# Patient Record
Sex: Male | Born: 1950 | Race: White | Hispanic: No | Marital: Married
Health system: Midwestern US, Academic
[De-identification: ages and names within clinical notes are randomized; demographics above are authoritative.]

---

## 2017-04-10 ENCOUNTER — Encounter: Admit: 2017-04-10 | Discharge: 2017-04-10 | Payer: MEDICARE

## 2017-04-10 NOTE — Telephone Encounter
[facility] that pt needed cell adapter, since he moved in May his transmitter wasn't working. I called pt.  He moved in early May. Last remote connection was 03/11/17 because he has not plugged his transmitter back in. He does have cell adapter (pt thought this was a wifi adapter.) I told him to plug transmitter back in. He was on the road but he said he would.

## 2017-04-27 ENCOUNTER — Ambulatory Visit: Admit: 2017-04-27 | Discharge: 2017-04-27 | Payer: BC Managed Care – PPO

## 2017-04-27 DIAGNOSIS — Z950 Presence of cardiac pacemaker: ICD-10-CM

## 2017-04-27 DIAGNOSIS — I441 Atrioventricular block, second degree: Principal | ICD-10-CM

## 2017-07-26 DIAGNOSIS — R55 Syncope and collapse: Secondary | ICD-10-CM

## 2017-07-27 ENCOUNTER — Ambulatory Visit: Admit: 2017-07-27 | Discharge: 2017-07-27 | Payer: BC Managed Care – PPO

## 2017-07-27 DIAGNOSIS — Z950 Presence of cardiac pacemaker: ICD-10-CM

## 2017-07-27 DIAGNOSIS — I442 Atrioventricular block, complete: Principal | ICD-10-CM

## 2017-07-27 DIAGNOSIS — I441 Atrioventricular block, second degree: ICD-10-CM

## 2017-09-05 ENCOUNTER — Encounter: Admit: 2017-09-05 | Discharge: 2017-09-05 | Payer: MEDICARE

## 2017-09-05 NOTE — Progress Notes
URGENT    REQUEST FOR MEDICAL RECORDS FOR CONTINUITY OF CARE-UPCOMING OFFICE VISIT    PLEASE FAX:     MOST RECENT LAB/LIPID RESULTS      PLEASE FAX TO [phone]  ATTENTION:  Claire Dolores,RN    THANK YOU

## 2017-09-21 ENCOUNTER — Encounter: Admit: 2017-09-21 | Discharge: 2017-09-21 | Payer: MEDICARE

## 2017-09-21 DIAGNOSIS — S0990XA Unspecified injury of head, initial encounter: ICD-10-CM

## 2017-09-21 DIAGNOSIS — I10 Essential (primary) hypertension: ICD-10-CM

## 2017-09-21 DIAGNOSIS — R55 Syncope and collapse: ICD-10-CM

## 2017-09-21 DIAGNOSIS — E785 Hyperlipidemia, unspecified: ICD-10-CM

## 2017-09-21 MED ORDER — CARVEDILOL 6.25 MG PO TAB
6.25 mg | ORAL_TABLET | Freq: Two times a day (BID) | ORAL | 3 refills | 90.00000 days | Status: AC
Start: 2017-09-21 — End: 2017-10-16
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
## 2017-09-22 ENCOUNTER — Ambulatory Visit: Admit: 2017-09-21 | Discharge: 2017-09-22 | Payer: BC Managed Care – PPO

## 2017-09-22 DIAGNOSIS — I519 Heart disease, unspecified: ICD-10-CM

## 2017-09-22 DIAGNOSIS — Z79899 Other long term (current) drug therapy: ICD-10-CM

## 2017-09-22 DIAGNOSIS — I10 Essential (primary) hypertension: Principal | ICD-10-CM

## 2017-09-22 DIAGNOSIS — Z950 Presence of cardiac pacemaker: ICD-10-CM

## 2017-10-05 ENCOUNTER — Ambulatory Visit: Admit: 2017-10-05 | Discharge: 2017-10-05 | Payer: BC Managed Care – PPO

## 2017-10-05 DIAGNOSIS — I10 Essential (primary) hypertension: Principal | ICD-10-CM

## 2017-10-05 MED ORDER — PERFLUTREN LIPID MICROSPHERES 1.1 MG/ML IV SUSP
1-20 mL | Freq: Once | INTRAVENOUS | 0 refills | Status: CP
Start: 2017-10-05 — End: ?
  Administered 2017-10-05: 18:00:00 2 mL via INTRAVENOUS

## 2017-10-05 NOTE — Progress Notes
Peripheral IV Insertion Note:  Patient Side: left  Line Orientation:Antecubital  IV Catheter Size: 22G  Number of Attempts:1.  IV capped and flushed with Normal Saline.  IV site without redness, swelling, or pain.  New dressing placed.    After procedure IV cannula removed intact and hemostasis achieved.

## 2017-10-16 ENCOUNTER — Encounter: Admit: 2017-10-16 | Discharge: 2017-10-16 | Payer: MEDICARE

## 2017-10-16 DIAGNOSIS — I10 Essential (primary) hypertension: Principal | ICD-10-CM

## 2017-10-16 MED ORDER — CARVEDILOL 6.25 MG PO TAB
9.375 mg | Freq: Two times a day (BID) | ORAL | 0 refills | 90.00000 days | Status: AC
Start: 2017-10-16 — End: 2018-09-03

## 2017-10-16 NOTE — Telephone Encounter
-----   Message from Harrie ForemanJonathan Freeman, MD sent at 10/15/2017  9:22 AM CST -----  Regarding: RE: Echo results  Jacki ConesLaurie,    Yes I am sorry did not ask you to get in touch with him, I am not sure how I did not send in basket message.  I reviewed his echocardiogram and compared to previous.    On my review, EF is unchanged if not slightly improved, about 45% just mildly depressed    Let us continue with carvedilol.  Please check on his home blood pressure readings, if consistently greater than 130, I would like to increase to 9.375 twice daily    Thanks  Jon  ----- Message -----  From: Ginette OttoNelson, Rachael Zapanta, RN  Sent: 10/15/2017   8:57 AM  To: Harrie ForemanJonathan Freeman, MD  Subject: Echo results                                     Jon,    Noted you reviewed Echo completed 10/05/17.  EF remains depressed.    OK to call pt with results?  Any further adjustment in Carvedilol.    Thank you,    Jacki ConesLaurie

## 2017-10-16 NOTE — Telephone Encounter
Able to call and discuss with Mr Wesley Stanley.    Stopped coffee for 3 days and SBP was 120's. However, if he has coffee, his SBP is >130's    He usually drinks about 3-4 cups per day.      Discussed with Dr Neale BurlyFreeman and will go ahead and make increase in Carvedilol.      Pt will call for low BP/HR readings/dizziness/lightheadedness. He will check BP before doses.  LN

## 2017-10-25 ENCOUNTER — Ambulatory Visit: Admit: 2017-10-25 | Discharge: 2017-10-26 | Payer: BC Managed Care – PPO

## 2017-10-26 DIAGNOSIS — I441 Atrioventricular block, second degree: ICD-10-CM

## 2017-10-26 DIAGNOSIS — I442 Atrioventricular block, complete: Principal | ICD-10-CM

## 2017-10-26 DIAGNOSIS — R55 Syncope and collapse: Secondary | ICD-10-CM

## 2017-10-26 DIAGNOSIS — Z950 Presence of cardiac pacemaker: Secondary | ICD-10-CM

## 2018-01-24 ENCOUNTER — Ambulatory Visit: Admit: 2018-01-24 | Discharge: 2018-01-25 | Payer: BC Managed Care – PPO

## 2018-01-24 DIAGNOSIS — I441 Atrioventricular block, second degree: Principal | ICD-10-CM

## 2018-01-25 DIAGNOSIS — Z950 Presence of cardiac pacemaker: Secondary | ICD-10-CM

## 2018-02-07 ENCOUNTER — Encounter: Admit: 2018-02-07 | Discharge: 2018-02-07 | Payer: MEDICARE

## 2018-02-07 DIAGNOSIS — R55 Syncope and collapse: ICD-10-CM

## 2018-02-07 DIAGNOSIS — S0990XA Unspecified injury of head, initial encounter: ICD-10-CM

## 2018-02-07 DIAGNOSIS — E785 Hyperlipidemia, unspecified: Principal | ICD-10-CM

## 2018-02-07 DIAGNOSIS — I10 Essential (primary) hypertension: ICD-10-CM

## 2018-03-14 ENCOUNTER — Encounter: Admit: 2018-03-14 | Discharge: 2018-03-14 | Payer: MEDICARE

## 2018-04-09 ENCOUNTER — Ambulatory Visit: Admit: 2018-04-09 | Discharge: 2018-04-10 | Payer: BC Managed Care – PPO

## 2018-04-09 ENCOUNTER — Encounter: Admit: 2018-04-09 | Discharge: 2018-04-09 | Payer: MEDICARE

## 2018-04-09 DIAGNOSIS — Z950 Presence of cardiac pacemaker: Principal | ICD-10-CM

## 2018-04-10 DIAGNOSIS — I441 Atrioventricular block, second degree: Principal | ICD-10-CM

## 2018-04-24 ENCOUNTER — Encounter: Admit: 2018-04-24 | Discharge: 2018-04-24 | Payer: MEDICARE

## 2018-04-24 ENCOUNTER — Ambulatory Visit: Admit: 2018-04-24 | Discharge: 2018-04-25 | Payer: BC Managed Care – PPO

## 2018-04-24 DIAGNOSIS — R55 Syncope and collapse: ICD-10-CM

## 2018-04-24 DIAGNOSIS — I441 Atrioventricular block, second degree: ICD-10-CM

## 2018-04-24 DIAGNOSIS — I10 Essential (primary) hypertension: Principal | ICD-10-CM

## 2018-04-24 DIAGNOSIS — S0990XA Unspecified injury of head, initial encounter: ICD-10-CM

## 2018-04-24 DIAGNOSIS — E785 Hyperlipidemia, unspecified: Principal | ICD-10-CM

## 2018-04-25 ENCOUNTER — Ambulatory Visit: Admit: 2018-04-25 | Discharge: 2018-04-26 | Payer: BC Managed Care – PPO

## 2018-04-26 DIAGNOSIS — Z950 Presence of cardiac pacemaker: ICD-10-CM

## 2018-04-26 DIAGNOSIS — I441 Atrioventricular block, second degree: Principal | ICD-10-CM

## 2018-07-26 ENCOUNTER — Ambulatory Visit: Admit: 2018-07-26 | Discharge: 2018-07-26 | Payer: BC Managed Care – PPO

## 2018-07-26 DIAGNOSIS — Z950 Presence of cardiac pacemaker: Secondary | ICD-10-CM

## 2018-07-26 DIAGNOSIS — I441 Atrioventricular block, second degree: Principal | ICD-10-CM

## 2018-09-03 ENCOUNTER — Encounter: Admit: 2018-09-03 | Discharge: 2018-09-03 | Payer: MEDICARE

## 2018-09-03 MED ORDER — CARVEDILOL 6.25 MG PO TAB
9.375 mg | ORAL_TABLET | Freq: Two times a day (BID) | ORAL | 3 refills | 90.00000 days | Status: AC
Start: 2018-09-03 — End: 2018-09-25

## 2018-09-25 ENCOUNTER — Encounter: Admit: 2018-09-25 | Discharge: 2018-09-25 | Payer: MEDICARE

## 2018-09-25 MED ORDER — CARVEDILOL 6.25 MG PO TAB
9.375 mg | ORAL_TABLET | Freq: Two times a day (BID) | ORAL | 1 refills | 90.00000 days | Status: AC
Start: 2018-09-25 — End: 2018-11-25

## 2018-11-05 ENCOUNTER — Encounter: Admit: 2018-11-05 | Discharge: 2018-11-05 | Payer: MEDICARE

## 2018-11-08 ENCOUNTER — Encounter: Admit: 2018-11-08 | Discharge: 2018-11-08 | Payer: MEDICARE

## 2018-11-11 ENCOUNTER — Ambulatory Visit: Admit: 2018-11-11 | Discharge: 2018-11-12 | Payer: MEDICARE

## 2018-11-12 DIAGNOSIS — R55 Syncope and collapse: Secondary | ICD-10-CM

## 2018-11-12 DIAGNOSIS — Z950 Presence of cardiac pacemaker: Secondary | ICD-10-CM

## 2018-11-12 DIAGNOSIS — I441 Atrioventricular block, second degree: Secondary | ICD-10-CM

## 2018-11-25 ENCOUNTER — Encounter: Admit: 2018-11-25 | Discharge: 2018-11-25 | Payer: MEDICARE

## 2018-11-25 MED ORDER — CARVEDILOL 6.25 MG PO TAB
9.375 mg | ORAL_TABLET | Freq: Two times a day (BID) | ORAL | 1 refills | 90.00000 days | Status: AC
Start: 2018-11-25 — End: 2019-05-07

## 2019-02-13 ENCOUNTER — Encounter: Admit: 2019-02-13 | Discharge: 2019-02-13 | Payer: MEDICARE

## 2019-02-13 ENCOUNTER — Ambulatory Visit: Admit: 2019-02-13 | Discharge: 2019-02-14 | Payer: MEDICARE

## 2019-02-14 DIAGNOSIS — Z950 Presence of cardiac pacemaker: ICD-10-CM

## 2019-02-14 DIAGNOSIS — I441 Atrioventricular block, second degree: Principal | ICD-10-CM

## 2019-02-14 DIAGNOSIS — R55 Syncope and collapse: ICD-10-CM

## 2019-05-02 ENCOUNTER — Encounter: Admit: 2019-05-02 | Discharge: 2019-05-02

## 2019-05-02 ENCOUNTER — Ambulatory Visit: Admit: 2019-05-02 | Discharge: 2019-05-02

## 2019-05-02 DIAGNOSIS — Z950 Presence of cardiac pacemaker: Secondary | ICD-10-CM

## 2019-05-02 DIAGNOSIS — I441 Atrioventricular block, second degree: Secondary | ICD-10-CM

## 2019-05-05 ENCOUNTER — Encounter: Admit: 2019-05-05 | Discharge: 2019-05-05

## 2019-05-05 NOTE — Progress Notes
Mena Goes has an appointment with Dr. Domingo Cocking on 05/07/2019.    Please send recent lab results for continuity of care.    Thank you,   Kelsi Benham    Phone: [phone]  Fax: [phone]

## 2019-05-07 ENCOUNTER — Encounter: Admit: 2019-05-07 | Discharge: 2019-05-07

## 2019-05-07 ENCOUNTER — Ambulatory Visit: Admit: 2019-05-07 | Discharge: 2019-05-08

## 2019-05-07 DIAGNOSIS — I441 Atrioventricular block, second degree: Secondary | ICD-10-CM

## 2019-05-07 DIAGNOSIS — Z950 Presence of cardiac pacemaker: Secondary | ICD-10-CM

## 2019-05-07 DIAGNOSIS — I10 Essential (primary) hypertension: Secondary | ICD-10-CM

## 2019-05-07 DIAGNOSIS — R55 Syncope and collapse: Secondary | ICD-10-CM

## 2019-05-07 DIAGNOSIS — Z8679 Personal history of other diseases of the circulatory system: Secondary | ICD-10-CM

## 2019-05-07 DIAGNOSIS — S0990XA Unspecified injury of head, initial encounter: Secondary | ICD-10-CM

## 2019-05-07 DIAGNOSIS — E785 Hyperlipidemia, unspecified: Secondary | ICD-10-CM

## 2019-05-07 MED ORDER — CARVEDILOL 12.5 MG PO TAB
12.5 mg | ORAL_TABLET | Freq: Two times a day (BID) | ORAL | 3 refills | 90.00000 days | Status: DC
Start: 2019-05-07 — End: 2020-05-04

## 2019-05-07 NOTE — Patient Instructions
Your EKG looks fine today, unchanged    Your exam is normal    Recent pacemaker check looks fine    Your weight looks good.  Keep up your exercise habits    I'll plan for a follow up clinic visit in 1 year, or sooner if you're having questions or problems.

## 2019-05-07 NOTE — Progress Notes
Date of Service: 05/07/2019    Wesley Stanley is a 68 y.o. male.       HPI       I had the opportunity to see Wesley Stanley back today for a followup visit.  Wesley Stanley is a delightful 68 year old individual.  I had last seen him about a year ago.  He has a history of high-grade AV block, relatively transient.  He had recurrent syncope.  His AV block was diagnosed on a ZIO Patch monitor in September 2017.  He had pauses of up to 3.3 seconds at that time.  That, coupled with his syncopal event, led to his pacemaker implant with [place] in October of 2017.     Wesley Stanley has been stable over the past year.  His pacemaker checks have shown minimal pacing, less than 1%.  He has had no concerning atrial or ventricular rhythm disturbances.  He had an in-office check with [place] just last week.     The patient had lab testing done in April, and his CBC was normal, CMP normal, TSH normal.     Wesley Stanley has mildly depressed LV function, EF of around 45%.  He is expressing no symptoms of heart failure whatsoever.     I was excited to hear about his home.  They have finally been able to move into the house just a week ago.  It took them about 3 years to construct the house.  They did most of the work on their own, and he is very proud of his work.  It is a beautiful home.  He showed me pictures today.  Wesley Stanley has lost about 20 pounds over the past year.  This has been deliberate.  He is doing an intermittent fasting diet.  He does not eat until he is hungry.  Oftentimes, this will be dinner before he has his first meal.    (ZOX:[id number])           Vitals:    05/07/19 1033 05/07/19 1034   BP: 120/82 128/82   BP Source: Arm, Right Upper Arm, Left Upper   Pulse: 72    Weight: 83.5 kg (184 lb)    Height: 1.778 m (5' 10)    PainSc: Zero      Body mass index is 26.4 kg/m???.     Past Medical History  Patient Active Problem List    Diagnosis Date Noted   ??? Pacemaker 03/16/2017   ??? Syncope 03/14/2017 07/24/16-Carotid US: Heterogeneous plaquing of the bilateral carotid bifurcations.  No evidence of hemodynamically significant stenosis of the bilateral carotid arterial systems.  Antegrade vertebral artery flow bilaterally.  No evidence of hemodynamically significant stenosis of the bilateral proximal subclavian arteries.   08/30/16-Regadenoson Thallium MPI:  EF 40%.  Study is abnormal primarily due to global LV hypokinesis with an estimated ejection fraction of 40%. No evidence of significant myocardial ischemia. ECG portion of the study is negative for ischemia.  ???     ??? 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 Abnormal ultrasound cardiogram 08/23/2016   ??? Hyperlipemia 08/17/2016   ??? HTN (hypertension) 08/17/2016     01/03/17-Echo:  Ef 45%.  No significant valve disease is identified.  Normal estimated PA systolic pressure.   10/05/17-Echo:  EF 40 %, global hypokinesis.  Grade I (mild) left ventricular diastolic dysfunction.  No significant valvular abnormalities.  Estimated Peak Systolic PA Pressure:  19 mmHg.  Sinuses of Valsalva are mildly dilated. Carvedilol dose increased.          ??? 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 AV block, 2nd degree      9/17 zio patch monitor:  Patient had a min HR of 20 bpm, max HR of 200 bpm, and avg HR of 93 bpm.  Predominant underlying rhythm was Sinus Rhythm.  4 episode(s) of AV Block (2nd??? Mobitz II and High Grade) occurred, lasting a total of 28 secs.   Episode of High Grade AV block was associated with 3.3 seconds of Ventricular Asystole.   3 Supraventricular Tachycardia runs occurred, the run with the fastest interval lasting 11 beats with a max rate of 200 bpm (avg 168 bpm); the run with the fastest interval was also the longest.  Isolated  SVEs were rare (<1.0%, 331), SVE Couplets were rare (<1.0%, 28), and SVE Triplets were rare (<1.0%, 1). Isolated VEs were rare (<1.0%, 249), VE Couplets were rare (<1.0%, 1), and VE Triplets were rare (<1.0%, 1).  No symptoms were reported  No atrial fibrillation or flutter was recorded           Review of Systems   Constitution: Negative.   HENT: Negative.    Eyes: Negative.    Cardiovascular: Negative.    Respiratory: Negative.    Endocrine: Negative.    Hematologic/Lymphatic: Negative.    Skin: Negative.    Gastrointestinal: Negative.    Genitourinary: Negative.    Neurological: Negative.    Psychiatric/Behavioral: Negative.    Allergic/Immunologic: Negative.        Physical Exam    General Appearance: resting comfortably, no acute distress  Skin: warm, moist, no ulcers or xanthomas  Digits and Nails: no clubbing  Eyes: conjunctivae and lids normal, pupils are equal and round  Lips & Oral Mucosa: no pallor or cyanosis  Ear, Nose, Throat: No deformities, posterior oral pharynx clear  Neck Veins: neck veins are flat, neck veins are not distended  Chest Inspection: chest is normal in appearance, pacemaker left chest  Respiratory Effort: breathing is unlabored, no respiratory distress  Auscultation/Percussion: lungs clear to auscultation, no rales, rhonchi, or wheezing  PMI: PMI not enlarged or displaced  Cardiac Rhythm: regular rhythm and normal rate  Cardiac Auscultation: Normal S1 & S2, no S3 or S4, no rub  Murmurs: no cardiac murmurs   Carotid Arteries: normal carotid upstroke bilaterally, no bruits  Pedal Pulses: normal symmetric pedal pulses  Lower Extremity Edema: no lower extremity edema  Abdominal Exam: soft, non-tender, no masses, bowel sounds normal  Abdominal Aorta: nonpalpable abdominal aorta; no abdominal bruits  Liver & Spleen: no organomegaly  Gait & Station: normal balance and gait  Muscle Strength: normal strength and tone  Neurologic Exam: neurological assessment grossly intact  Orientation: oriented to time, place and person  Affect & Mood: appropriate and sustained affect Other: moves all extremities    Cardiovascular Studies  ECG???normal sinus rhythm, 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   ??? AV block, 2nd degree Yes   ??? Second-degree heart block        Assessment and Plan     1. Essential hypertension.  The patient's blood pressure is at goal, 120/82.  He will continue on carvedilol.  2. Pacemaker.  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  His rhythm disturbance was discovered on a Zio patch monitor at that  time.  Shortly thereafter, he underwent pacemaker implant.  He has a St. Jude dual-chamber device.  This was interrogated in the office a week ago.  We are seeing less than 1% A or V pacing.  No arrhythmias were identified with this recent check.  Notably, he has had no further syncopal episodes.  His pacing really has been minimal over the past 2 years.  3. Hyperlipidemia.  Dr. Bethena Midget, I will defer to you for ongoing lipid management.  He is currently not on lipid-lowering therapy.  This is an individual who is not known to have coronary artery disease.  I would like to see his LDL ideally less than 100.  Notably, his stress test with [place] in 2017 was normal with respect to perfusion without suggestion of coronary disease.     I look forward to seeing Wesley Stanley back in the office in 1 year.  He will have a pacemaker check around that timeframe.    (OZD:[id number])             Current Medications (including today's revisions)  ??? aspirin EC 81 mg tablet Take 81 mg by mouth daily. Take with food.   ??? CALCIUM CARBONATE/VITAMIN D3 (VITAMIN D-3 PO) Take  by mouth.   ??? carvediloL (COREG) 12.5 mg tablet Take one tablet by mouth twice daily. Take with food.   ??? cyanocobalamin (vitamin B-12) (VITAMIN B-12 PO) Place 1,000 mcg under tongue daily.   ??? MULTIVITAMIN PO Take  by mouth.   ??? NP THYROID 60 mg (1 gr) tablet Take 60 mg by mouth daily.   ??? TESTOSTERONE IM Inject  into the muscle every 7 days.

## 2019-05-08 DIAGNOSIS — E7849 Other hyperlipidemia: Secondary | ICD-10-CM

## 2019-05-30 ENCOUNTER — Encounter: Admit: 2019-05-30 | Discharge: 2019-05-30

## 2019-06-05 ENCOUNTER — Encounter: Admit: 2019-06-05 | Discharge: 2019-06-05

## 2019-06-05 ENCOUNTER — Ambulatory Visit: Admit: 2019-06-05 | Discharge: 2019-06-05

## 2019-06-05 DIAGNOSIS — I441 Atrioventricular block, second degree: Principal | ICD-10-CM

## 2019-06-05 DIAGNOSIS — Z950 Presence of cardiac pacemaker: Secondary | ICD-10-CM

## 2019-08-19 ENCOUNTER — Ambulatory Visit: Admit: 2019-08-19 | Discharge: 2019-08-19 | Payer: MEDICARE

## 2019-08-19 DIAGNOSIS — Z950 Presence of cardiac pacemaker: Secondary | ICD-10-CM

## 2019-08-19 DIAGNOSIS — I441 Atrioventricular block, second degree: Secondary | ICD-10-CM

## 2019-11-18 ENCOUNTER — Ambulatory Visit: Admit: 2019-11-18 | Discharge: 2019-11-18 | Payer: MEDICARE

## 2019-11-18 DIAGNOSIS — Z950 Presence of cardiac pacemaker: Secondary | ICD-10-CM

## 2019-11-18 DIAGNOSIS — I441 Atrioventricular block, second degree: Secondary | ICD-10-CM

## 2020-05-04 ENCOUNTER — Encounter: Admit: 2020-05-04 | Discharge: 2020-05-04 | Payer: MEDICARE

## 2020-05-04 DIAGNOSIS — I441 Atrioventricular block, second degree: Secondary | ICD-10-CM

## 2020-05-04 MED ORDER — CARVEDILOL 12.5 MG PO TAB
ORAL_TABLET | Freq: Two times a day (BID) | ORAL | 0 refills | 90.00000 days | Status: AC
Start: 2020-05-04 — End: ?

## 2020-05-04 NOTE — Progress Notes
STAT    Records Request for continuity of care.    Patient Wesley Stanley DOB 07-16-1951      Please fax:     Most recent lab results from May of 2020 to current    Please fax results to [phone]    Thank you,    Floyde Parkins, RN  Nurse with Harrie Foreman, MD

## 2020-05-12 ENCOUNTER — Encounter: Admit: 2020-05-12 | Discharge: 2020-05-12 | Payer: MEDICARE

## 2020-06-03 ENCOUNTER — Encounter: Admit: 2020-06-03 | Discharge: 2020-06-03 | Payer: MEDICARE

## 2020-06-03 NOTE — Telephone Encounter
06/03/20 We did not receive a transmission today. I attempted to reach patient and left voicemail to notify patient and gave him instructions for how to send a manual transmission. Left callback number if he has any further questions.

## 2020-06-03 NOTE — Telephone Encounter
06/03/20 Remote transmission received. I left voicemail to notify patient.

## 2020-06-03 NOTE — Telephone Encounter
Received notification that  St. Jude Merlin has not been connected since 04-03-2020.       Patient was instructed to look at his/her transmitter to make sure that it is plugged into power and send a manual transmission to reconnect the transmitter. If he/she has any questions about how to send a transmission or if the transmitter does not appear to be working properly, they need to contact the device company directly. Patient was provided with that contact number. Requested the patient send [place] a MyChart message or contact our device nurses at [phone] to let us know after they have sent their transmission.  Called preferred phone number [phone], Spoke with Wesley Stanley. Patient verbalized understanding. Pt does not login to MyChart. If he needs help hell call Merlin.     CDJ

## 2020-07-15 ENCOUNTER — Encounter: Admit: 2020-07-15 | Discharge: 2020-07-15 | Payer: MEDICARE

## 2020-07-15 NOTE — Progress Notes
Wesley Stanley, [birth year] has an appointment with Dr. Neale Burly on 07/21/20.    Please send recent lab results for continuity of care.    Thank you,   [name]    Phone: [phone]  Fax: [phone]

## 2020-07-16 ENCOUNTER — Encounter: Admit: 2020-07-16 | Discharge: 2020-07-16 | Payer: MEDICARE

## 2020-07-16 ENCOUNTER — Ambulatory Visit: Admit: 2020-07-16 | Discharge: 2020-07-16 | Payer: MEDICARE

## 2020-07-16 DIAGNOSIS — I441 Atrioventricular block, second degree: Secondary | ICD-10-CM

## 2020-07-21 ENCOUNTER — Encounter: Admit: 2020-07-21 | Discharge: 2020-07-21 | Payer: MEDICARE

## 2020-07-21 ENCOUNTER — Ambulatory Visit: Admit: 2020-07-21 | Discharge: 2020-07-21 | Payer: MEDICARE

## 2020-07-21 DIAGNOSIS — I10 Essential (primary) hypertension: Secondary | ICD-10-CM

## 2020-07-21 DIAGNOSIS — E785 Hyperlipidemia, unspecified: Principal | ICD-10-CM

## 2020-07-21 DIAGNOSIS — I441 Atrioventricular block, second degree: Secondary | ICD-10-CM

## 2020-07-21 DIAGNOSIS — S0990XA Unspecified injury of head, initial encounter: Secondary | ICD-10-CM

## 2020-07-21 DIAGNOSIS — R55 Syncope and collapse: Secondary | ICD-10-CM

## 2020-07-21 NOTE — Patient Instructions
Exam is normal    EKG looks fine, no concerns    We have made no adjustment in your current medications    Stay active as you are    I'll plan for a follow up clinic visit in 1 year, or sooner if you're having questions or problems.     We will check your pacemaker just prior    I work with a wonderful group of nurses that are part of the purple team.  There is a shared phone number for this group of nurses, [phone].  Please contact this number should you have any questions about your visit today or concerns about upcoming appointments/procedures.  We would like to know if you are having any worrisome cardiac symptoms.     Please call [phone] if needing to schedule an office visit.     We are also available by e-mail through the MyChart system if you have non-urgent concerns. We will get back to you as soon as possible. For urgent or after hours needs, please call [phone].

## 2020-08-02 ENCOUNTER — Encounter: Admit: 2020-08-02 | Discharge: 2020-08-02 | Payer: MEDICARE

## 2020-08-02 DIAGNOSIS — I441 Atrioventricular block, second degree: Secondary | ICD-10-CM

## 2020-08-02 MED ORDER — CARVEDILOL 12.5 MG PO TAB
ORAL_TABLET | Freq: Two times a day (BID) | ORAL | 0 refills | 90.00000 days | Status: AC
Start: 2020-08-02 — End: ?

## 2020-08-18 ENCOUNTER — Encounter: Admit: 2020-08-18 | Discharge: 2020-08-18 | Payer: MEDICARE

## 2020-10-31 ENCOUNTER — Encounter: Admit: 2020-10-31 | Discharge: 2020-10-31 | Payer: MEDICARE

## 2020-10-31 DIAGNOSIS — I441 Atrioventricular block, second degree: Principal | ICD-10-CM

## 2020-10-31 MED ORDER — CARVEDILOL 12.5 MG PO TAB
ORAL_TABLET | Freq: Two times a day (BID) | 0 refills
Start: 2020-10-31 — End: ?

## 2020-11-19 ENCOUNTER — Encounter: Admit: 2020-11-19 | Discharge: 2020-11-19 | Payer: MEDICARE

## 2021-02-17 ENCOUNTER — Encounter: Admit: 2021-02-17 | Discharge: 2021-02-17 | Payer: MEDICARE

## 2021-02-25 ENCOUNTER — Encounter: Admit: 2021-02-25 | Discharge: 2021-02-25 | Payer: MEDICARE

## 2021-02-25 ENCOUNTER — Ambulatory Visit: Admit: 2021-02-25 | Discharge: 2021-02-25 | Payer: MEDICARE

## 2021-02-25 DIAGNOSIS — Z950 Presence of cardiac pacemaker: Secondary | ICD-10-CM

## 2021-02-25 DIAGNOSIS — I441 Atrioventricular block, second degree: Principal | ICD-10-CM

## 2021-02-25 DIAGNOSIS — I10 Essential (primary) hypertension: Secondary | ICD-10-CM

## 2021-02-25 MED ORDER — CARVEDILOL 25 MG PO TAB
25 mg | ORAL_TABLET | Freq: Two times a day (BID) | ORAL | 1 refills | 90.00000 days | Status: AC
Start: 2021-02-25 — End: ?

## 2021-02-25 NOTE — Telephone Encounter
02/25/2021 2:02 PM     Pt called reports that awhile back, he started experiencing CP/tightness with exertion and remembered that JAF had mentioned increasing his carvedilol but pt had been apprehensive at the time. Since then he has increased carvedilol to 25mg  BID (2 12.5mg  tabs BID) and his CP/tightness with exertion has completely resolved. Pt reports that BPs run about 110-120s/70-80s and pt "feels great" since med dosage increase.      Informed pt that I will update his chart and med list and let JAF know. Asked that he let staff know if he needs more refills sent to pharmacy before his annual appt with JAF in Sept. Pt verbalized understanding & agreeable to POC.

## 2021-04-26 ENCOUNTER — Encounter: Admit: 2021-04-26 | Discharge: 2021-04-26 | Payer: MEDICARE

## 2021-04-26 NOTE — Telephone Encounter
Received notification that  [employer]. Jude Merlin has not been connected since 04/23/21. Patient was instructed to look at his/her transmitter to make sure that it is plugged into power and send a manual transmission to reconnect the transmitter. If he/she has any questions about how to send a transmission or if the transmitter does not appear to be working properly, they need to contact the device company directly. Patient was provided with that contact number. Requested the patient send [place] a MyChart message or contact our device nurses at [phone] to let us know after they have sent their transmission. Patient has been out of town but is getting back today and will send in a transmission to reconnect. Patient verbalized understanding.  BC    Note: Patient needs to send a manual remote interrogation to reestablish communication to his/her remote transmitter.

## 2021-04-29 ENCOUNTER — Encounter: Admit: 2021-04-29 | Discharge: 2021-04-29 | Payer: MEDICARE

## 2021-05-03 ENCOUNTER — Encounter: Admit: 2021-05-03 | Discharge: 2021-05-03 | Payer: MEDICARE

## 2021-05-03 NOTE — Telephone Encounter
-----   Message from Jana Half, MD sent at 05/03/2021  2:42 PM CDT -----  Regarding: RE: Possible Side Effects of my medication   Aadi Bordner,    We are going to try to get him worked in with Augusto Gamble in 2 weeks at [place] office to discuss symptoms and likely arrange for testing    [facility] was going to look at date in July for Jody    Thanks  ----- Message -----  From: Laroy Apple, RN  Sent: 05/03/2021   9:21 AM CDT  To: Jana Half, MD  Subject: FW: Possible Side Effects of my medication       JAF. See pt communication about pt not feeling well recently with SOB and occasional chest pressure. I ended up calling pt as I wanted to confirm dose on Coreg. Pt is taking 25mg  BID (two 12.5mg  tabs BID). He is not sure why he is taking 2 baby ASA not one.     He did say he does not feel like this is urgent but did not wanted you to be surprised in Sept at his s/s. I assured him we would want to know about any changes going on. Pt states he wonders if he has Covid long haulers issues. Pt is trying to start a walking program on level ground and will slowly progress as he tolerates with his SOB. I said I would review with JAF to see if work-in needed or any testing completed. Pt would need an OV if any stress testing needed. Thank you    ----- Message -----  From: 08-25-1975, BSN  Sent: 04/30/2021   3:37 PM CDT  To: [facility] Nurse Gen Card Team [facility]  Subject: FW: Possible Side Effects of my medication         ----- Message -----  From: 05/02/2021  Sent: 04/29/2021   9:29 PM CDT  To: [facility] Nurse Gen Card Team 05/01/2021  Subject: Possible Side Effects of my medication           And 2 baby aspirin with my vitamins everyday.

## 2021-05-05 ENCOUNTER — Encounter: Admit: 2021-05-05 | Discharge: 2021-05-05 | Payer: MEDICARE

## 2021-05-05 NOTE — Telephone Encounter
05/05/2021 9:50 AM     Called pt and discussed appt with SHT on 7/7 at the [place] office. Pt confirmed that this would work for him. Appt scheduled, gave pt directions to the [place] office. MC

## 2021-05-10 ENCOUNTER — Encounter: Admit: 2021-05-10 | Discharge: 2021-05-10 | Payer: MEDICARE

## 2021-05-10 NOTE — Progress Notes
Records Request    Medical records request for continuation of care:    Patient has appointment on 7.7.22   with  Dr. Carollee Herter Hoos-Thompson .    Please fax records to [facility] [place] [facility] [phone]    Request records:    Most Recent Labs    Thank you,      Cardiovascular Medicine  [facility]  [address]  [place], [place] [address]  Phone:  [phone]  Fax:  [phone]

## 2021-05-12 ENCOUNTER — Encounter: Admit: 2021-05-12 | Discharge: 2021-05-12 | Payer: MEDICARE

## 2021-05-12 DIAGNOSIS — I10 Essential (primary) hypertension: Secondary | ICD-10-CM

## 2021-05-12 DIAGNOSIS — Z950 Presence of cardiac pacemaker: Secondary | ICD-10-CM

## 2021-05-12 DIAGNOSIS — R55 Syncope and collapse: Secondary | ICD-10-CM

## 2021-05-12 DIAGNOSIS — R0789 Other chest pain: Secondary | ICD-10-CM

## 2021-05-12 DIAGNOSIS — R0600 Dyspnea, unspecified: Secondary | ICD-10-CM

## 2021-05-12 DIAGNOSIS — U071 COVID: Secondary | ICD-10-CM

## 2021-05-12 DIAGNOSIS — S0990XA Unspecified injury of head, initial encounter: Secondary | ICD-10-CM

## 2021-05-12 DIAGNOSIS — I441 Atrioventricular block, second degree: Secondary | ICD-10-CM

## 2021-05-12 DIAGNOSIS — E785 Hyperlipidemia, unspecified: Secondary | ICD-10-CM

## 2021-05-12 MED ORDER — ROSUVASTATIN 10 MG PO TAB
10 mg | ORAL_TABLET | Freq: Every day | ORAL | 3 refills | 90.00000 days | Status: AC
Start: 2021-05-12 — End: ?

## 2021-05-12 MED ORDER — CARVEDILOL 25 MG PO TAB
25 mg | ORAL_TABLET | Freq: Two times a day (BID) | ORAL | 3 refills | 90.00000 days | Status: AC
Start: 2021-05-12 — End: ?

## 2021-05-12 MED ORDER — CARVEDILOL 25 MG PO TAB
25 mg | ORAL_TABLET | Freq: Two times a day (BID) | ORAL | 1 refills | 90.00000 days | Status: DC
Start: 2021-05-12 — End: 2021-05-12

## 2021-05-12 MED ORDER — ROSUVASTATIN 10 MG PO TAB
10 mg | ORAL_TABLET | Freq: Every day | ORAL | 1 refills | 90.00000 days | Status: DC
Start: 2021-05-12 — End: 2021-05-12

## 2021-05-12 NOTE — Patient Instructions
MAC Nuclear Stress Test Instructions    PLEASE REPORT TO:    ____[facility] [phone], [place], [place]) - [phone]   ____[facility] ([address], [address], [place], [place]) - [phone]    ____[facility] ([address] [place], [place]) - [phone]    ____[facility] ([address], [place], [place]) - [phone]   ____[facility] ([address] [place], [place] ) - [phone]     MAC Main Phone Number: [phone]     Date of Test  ____________  at ____________  for ________________________    Are you able to raise your arm up by your head for about 20 minutes? yes  Can you lie on your back for approximately 20 minutes with minimal movement? yes     The Thallium evaluation has two parts -- two nuclear scans.   The first scan is done in the morning and the second three to four hours later.     Wear comfortable clothing. Bring or wear comfortable walking shoes.     It is recommended not to hold infants for 2 to 3 days after the test.   Please let the nuclear technologists know if you plan on flying after the test.     NO DECAF OR CAFFEINE 24 HOURS PRIOR TO TEST. Examples: coffee, tea, decaf coffee or tea, cola, chocolate.     DO NOT EAT OR DRINK THE MORNING OF YOUR TEST unless otherwise instructed. (You may have a couple sips of water.) If you are a diabetic, if insulin dependent: please take one third of your insulin with a light breakfast (two pieces of dry toast and a small juice). Bring insulin and medication with you to the test.     ___ TAKE MORNING MEDICATIONS WITH A COUPLE SIPS OF WATER PRIOR TO TEST.     Hold all vitamins and supplements on the morning of your test.     HOLD THE FOLLOWING MEDICATIONS AS INDICATED BELOW:             WHAT TO DO BETWEEN THE FIRST TWO THALLIUM SCANS:  1. No strenuous exercise should be performed during this time.  2. A light lunch is permissible. The technologist will give you a list of appropriate foods.  3. Please return 15 minutes prior to the schedule of your second scan. Our nuclear technologist will tell you exactly what time to return.  4. Please do not use tobacco products in between scans.  5. After the first scan is completed, you may resume usual medications.     TEST FINDINGS:  You will receive the results of the test within 7 business days of its completion by telephone, unless arranged differently at the time of the procedure.  If you have any questions concerning your thallium test or if you do not hear from your [facility] physician/or nurse within 7 business days, please call the appropriate office checked above.            [facility] should call you.  Their direct line is [phone]    Follow up as directed.  Call sooner if issues.  Call the [place] nursing line at [phone].  Leave a detailed message for the nurse in [place] [facility] with how we can assist you and we will call you back.

## 2021-05-12 NOTE — Progress Notes
Date of Service: 05/12/2021    Wesley Stanley is a 70 y.o. male.       HPI     Patient is a 70 year old Caucasian male past medical history of syncopal episode in 2017 which led to a Zio patch and intermittent type II heart block.  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  He had COVID 19 in February of this year.  Since he is recovered from that acutely has noted increased activity intolerance and will experiencing chest heaviness when he is doing more vigorous activity like going up a hill or walking upstairs for exercise.  Initially increase his carvedilol from 12.5 mg p.o. twice daily to 25 mg p.o. twice daily and is seem to help.  Reports symptoms seem to breakthrough more currently.  He does take testosterone replacement therapy and testosterone levels have been elevated.  Cholesterol is also suboptimally controlled.  Takes chronic fish oil for his joint discomfort and relief.  No history of smoking, coronary artery disease, diabetes mellitus, or family history of early coronary artery disease.  Blood pressure is done really well on the carvedilol.  Denies any orthopnea or PND.  No recurrent syncope or near syncopal type episodes.  Device checks have been stable.  No palpitations or irregular heartbeats are detected.  Previous echocardiogram showed low normal left ventricular ejection fraction with echo and regadenoson study showing ejection fraction of 40% and no other structural concerns.         Vitals:    05/12/21 0916 05/12/21 0928   BP: (!) 136/90 (!) 140/84   BP Source: Arm, Left Upper Arm, Right Upper   Pulse: 80    SpO2: 96%    O2 Percent: 96 %    O2 Device: None (Room air)    PainSc: Zero    Weight: 89 kg (196 lb 3.2 oz)    Height: 177.8 cm (5' 10)      Body mass index is 28.15 kg/m?[name]     Past Medical History  Patient Active Problem List    Diagnosis Date Noted   ? Pacemaker 03/16/2017   ? Syncope 03/14/2017     07/24/16-Carotid US: Heterogeneous plaquing of the bilateral carotid bifurcations.  No evidence of hemodynamically significant stenosis of the bilateral carotid arterial systems.  Antegrade vertebral artery flow bilaterally.  No evidence of hemodynamically significant stenosis of the bilateral proximal subclavian arteries.   08/30/16-Regadenoson Thallium MPI:  EF 40%.  Study is abnormal primarily due to global LV hypokinesis with an estimated ejection fraction of 40%. No evidence of significant myocardial ischemia. ECG portion of the study is negative for ischemia.  ?     ? 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 Abnormal ultrasound cardiogram 08/23/2016   ? Hyperlipemia 08/17/2016   ? HTN (hypertension) 08/17/2016     01/03/17-Echo:  Ef 45%.  No significant valve disease is identified.  Normal estimated PA systolic pressure.   10/05/17-Echo:  EF 40 %, global hypokinesis.  Grade I (mild) left ventricular diastolic dysfunction.  No significant valvular abnormalities.  Estimated Peak Systolic PA Pressure: 19 mmHg.  Sinuses of Valsalva are mildly dilated. Carvedilol dose increased.          ? 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 AV block, 2nd degree      9/17 zio patch monitor:  Patient had a min  HR of 20 bpm, max HR of 200 bpm, and avg HR of 93 bpm.  Predominant underlying rhythm was Sinus Rhythm.  4 episode(s) of AV Block (2nd? Mobitz II and High Grade) occurred, lasting a total of 28 secs.   Episode of High Grade AV block was associated with 3.3 seconds of Ventricular Asystole.   3 Supraventricular Tachycardia runs occurred, the run with the fastest interval lasting 11 beats with a max rate of 200 bpm (avg 168 bpm); the run with the fastest interval was also the longest.  Isolated  SVEs were rare (<1.0%, 331), SVE Couplets were rare (<1.0%, 28), and SVE Triplets were rare (<1.0%, 1).  Isolated VEs were rare (<1.0%, 249), VE Couplets were rare (<1.0%, 1), and VE Triplets were rare (<1.0%, 1).  No symptoms were reported  No atrial fibrillation or flutter was recorded           Review of Systems   Constitutional: Negative.   HENT: Negative.         Nasal drip   Eyes: Negative.    Cardiovascular: Positive for chest pain and dyspnea on exertion.        Chest pressure on exertion   Respiratory: Positive for cough.    Endocrine: Negative.    Hematologic/Lymphatic: Negative.    Skin: Negative.    Musculoskeletal: Negative.    Gastrointestinal: Negative.    Genitourinary: Negative.    Neurological: Negative.    Psychiatric/Behavioral: Negative.         Confusion   Allergic/Immunologic: Negative.        Physical Exam  Awake and alert well-nourished male.  Lean body habitus and appears given age.  Accompanied by his wife  Pupils are equal react without scleral injection  Neck is supple normal carotid stroke and no bruits.  No masses no significant jugular venous abnormalities  Heart S1, S2 that are normal.  No murmurs, clicks, or gallops.  PMI is normal  Lungs are clear to auscultation without focal crackles or wheeze  Abdomen is soft, nontender and positive bowel sounds without bruits  Pulses are 2+, regular, and symmetric bilaterally radial as well as dorsalis pedis and posterior tibial locations  No peripheral edema normal hair growth in the legs and arms.  Does some superficial peeling of the skin in the pretibial location on the left      Cardiovascular Health Factors  Vitals BP Readings from Last 3 Encounters:   05/12/21 (!) 140/84   07/21/20 112/72   05/07/19 128/82     Wt Readings from Last 3 Encounters:   05/12/21 89 kg (196 lb 3.2 oz)   07/21/20 88.4 kg (194 lb 14.4 oz)   05/07/19 83.5 kg (184 lb)     BMI Readings from Last 3 Encounters:   05/12/21 28.15 kg/m?   07/21/20 27.97 kg/m?   05/07/19 26.40 kg/m?      Smoking Social History     Tobacco Use   Smoking Status Never Smoker   Smokeless Tobacco Never Used      Lipid Profile Cholesterol   Date Value Ref Range Status   03/02/2021 236 (H) <200 Final     HDL   Date Value Ref Range Status   03/02/2021 41  Final     LDL   Date Value Ref Range Status   03/02/2021 143 (H) <100 Final     Triglycerides   Date Value Ref Range Status   03/02/2021 337 (H) <150 Final  Blood Sugar No results found for: HGBA1C  Glucose   Date Value Ref Range Status   02/25/2020 104  Final   08/18/2016 105 (H) 70 - 100 MG/DL Final   82/95/6213 99 70 - 100 MG/DL Final   08/65/7846 98 70 - 100 mg/dL Final          Problems Addressed Today  Encounter Diagnoses   Name Primary?   ? Cardiac pacemaker in situ Yes   ? Other chest pain    ? COVID    ? DOE (dyspnea on exertion)        Assessment and Plan     Patient with persistent activity intolerance and chest pressure despite increase dosing of the carvedilol.  In discussion with the patient this did become an issue after his COVID-19 infection could be a sign of long-haul or COVID-19.  Does have risk factors for cardiovascular disease and findings do have a anginal component.  At this time we will continue the carvedilol at 25 mg p.o. twice daily.  We will have him start rosuvastatin 10 mg p.o. daily to help alleviate risk for cardiovascular events.  He is maintained on aspirin 81 mg p.o. daily.  Did discuss that he has to be cautious with testosterone therapy especially if he is overtreating.  We will arrange for treadmill nuclear cardiac stress test.  Determine any ischemia and reassess his LV function.  He should contact us if he is having escalating symptoms or be seen emergently if he has persistent symptoms that are not recovering from rest of the have been currently.  We will contact him about the results of his study.  Currently has follow-up appointment scheduled in September.         Current Medications (including today's revisions)  ? aspirin EC 81 mg tablet Take 81 mg by mouth daily. Take with food.   ? CALCIUM CARBONATE/VITAMIN D3 (VITAMIN D-3 PO) Take  by mouth daily.   ? carvediloL (COREG) 25 mg tablet Take one tablet by mouth twice daily with meals. Take with food.   ? cyanocobalamin (vitamin B-12) (VITAMIN B-12 PO) Place 1,000 mcg under tongue daily.   ? MAGNESIUM PO Take 470 mg by mouth daily.   ? NP THYROID 60 mg (1 gr) tablet Take 60 mg by mouth daily.   ? Omega-3-DHA-EPA-Fish Oil 1,000 mg (120 mg-180 mg) capsule Take 3 capsules by mouth daily.   ? TESTOSTERONE IM Inject  into the muscle every 7 days.

## 2021-05-20 ENCOUNTER — Encounter: Admit: 2021-05-20 | Discharge: 2021-05-20 | Payer: MEDICARE

## 2021-05-20 NOTE — Telephone Encounter
Patient was scheduled for a [employer] that has not been received. Patient was instructed to send a manual transmission. Instructed if the transmitter does not appear to be working properly, they need to contact the device company directly. Patient was provided with that contact number. Requested the patient send [place] a MyChart message or contact our device nurses at [phone] to let us know after they have sent their transmission. Spoke with patient and he will send in transmission today. Patient verbalized understanding.  BC  Note: Patient needs to send a manual transmission as we did not receive their scheduled remote interrogation.

## 2021-05-20 NOTE — Telephone Encounter
Patient calling [employer] for trouble shooting. BC

## 2021-05-25 ENCOUNTER — Encounter: Admit: 2021-05-25 | Discharge: 2021-05-25 | Payer: MEDICARE

## 2021-05-26 ENCOUNTER — Encounter: Admit: 2021-05-26 | Discharge: 2021-05-26 | Payer: MEDICARE

## 2021-05-27 ENCOUNTER — Encounter: Admit: 2021-05-27 | Discharge: 2021-05-27 | Payer: MEDICARE

## 2021-05-27 DIAGNOSIS — Z01812 Encounter for preprocedural laboratory examination: Secondary | ICD-10-CM

## 2021-05-27 DIAGNOSIS — I10 Essential (primary) hypertension: Secondary | ICD-10-CM

## 2021-05-27 DIAGNOSIS — R9439 Abnormal result of other cardiovascular function study: Secondary | ICD-10-CM

## 2021-05-27 MED ORDER — ACETAMINOPHEN 325 MG PO TAB
650 mg | ORAL | 0 refills | PRN
Start: 2021-05-27 — End: ?

## 2021-05-27 MED ORDER — ALUMINUM-MAGNESIUM HYDROXIDE 200-200 MG/5 ML PO SUSP
30 mL | ORAL | 0 refills | PRN
Start: 2021-05-27 — End: ?

## 2021-05-27 MED ORDER — DOCUSATE SODIUM 100 MG PO CAP
100 mg | Freq: Every day | ORAL | 0 refills | PRN
Start: 2021-05-27 — End: ?

## 2021-05-27 MED ORDER — NITROGLYCERIN 0.4 MG SL SUBL
.4 mg | SUBLINGUAL | 0 refills | PRN
Start: 2021-05-27 — End: ?

## 2021-05-27 MED ORDER — TEMAZEPAM 15 MG PO CAP
15 mg | Freq: Every evening | ORAL | 0 refills | PRN
Start: 2021-05-27 — End: ?

## 2021-05-27 NOTE — Telephone Encounter
05/27/2021 1:28 PM     Stress test images and report received from [facility]. [place], [place] sent report to be uploaded into onbase and images uploaded into nuance.     Verbally reviewed with JAF, who viewed the report and EKG images who states the EKGs are very abnormal and that he needs a LHC next week.

## 2021-05-27 NOTE — Telephone Encounter
-----   Message from Levora Angel, MD sent at 05/27/2021 11:38 AM CDT -----  Arrange for him to have coronary angiograms with possible PCI.  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  Thank you  ----- Message -----  From: Florene Route, RN  Sent: 05/27/2021  10:57 AM CDT  To: Levora Angel, MD    I am having them email me the rest of the stress report, EKG tracings, etc and I will forward to you for review. Thank you!  ----- Message -----  From: Levora Angel, MD  Sent: 05/27/2021  10:10 AM CDT  To: Florene Route, RN    Where do I find the report on the stress test?  ----- Message -----  From: Florene Route, RN  Sent: 05/27/2021   9:55 AM CDT  To: Levora Angel, MD    Abnormal Bruce MPI results for your review and recommendations. [facility] should also be clouding images over for review. Please let me or [facility] nursing team know your recommendations. Thank you!

## 2021-05-27 NOTE — Progress Notes
Abnormal stress results were recovered from [facility] after inadvertently being sent to [facility] to be read. Will forward results to [facility] for review and recommendations.

## 2021-05-27 NOTE — Telephone Encounter
Called and discussed abnormal results and recommendations with patient. Patient states that [facility] is not his normal provider and that he would really like to discuss options with his regular cardiologist, Dr. Neale Burly. Urged patient to reconsider procedure, but he requested to wait and speak with Dr. Neale Burly.       Spoke to [facility], RN on Dr. Onnie Boer team to inform her the patient's decision and the urgency of medical intervention. Mallori, RN states that she will review all stress test documents with Dr. Neale Burly and will reach out to the patient with recommendations.

## 2021-05-30 ENCOUNTER — Encounter: Admit: 2021-05-30 | Discharge: 2021-05-30 | Payer: MEDICARE

## 2021-05-30 DIAGNOSIS — R9439 Abnormal result of other cardiovascular function study: Secondary | ICD-10-CM

## 2021-05-31 ENCOUNTER — Encounter: Admit: 2021-05-31 | Discharge: 2021-05-31 | Payer: MEDICARE

## 2021-05-31 NOTE — Progress Notes
Medicare is listed as patient's primary insurance coverage.  Pre-certification is not required for hospitalizations.    Website with [employer] Program confirmed benefits and eligibility:  Current and active since 11/11/2006, this plan is secondary to Medicare A and B.  Plan waives prior authorization requirements for Medicare allowed services and procedures.  Reference [id number]

## 2021-06-05 ENCOUNTER — Encounter: Admit: 2021-06-05 | Discharge: 2021-06-05 | Payer: MEDICARE

## 2021-06-05 NOTE — Anesthesia Pre-Procedure Evaluation
Anesthesia Pre-Procedure Evaluation    Name: Wesley Stanley      MRN: [id number]     DOB: [birth year]     Age: 70 y.o.     Sex: male   _________________________________________________________________________     Procedure Info:   Procedure Information     Date/Time: 06/06/21 1651    Procedures:       ANGIOGRAPHY CORONARY ARTERY WITH LEFT HEART CATHETERIZATION (N/A )      POSSIBLE PERCUTANEOUS CORONARY STENT PLACEMENT WITH ANGIOPLASTY (N/A )    Location: CVOR 6 / CVOR    Providers: Julienne Kass, MD          Physical Assessment  Vital Signs (last filed in past 24 hours):         Patient History   No Known Allergies     Current Medications    Medication Directions   aspirin EC 81 mg tablet Take 81 mg by mouth daily. Take with food.   CALCIUM CARBONATE/VITAMIN D3 (VITAMIN D-3 PO) Take  by mouth daily.   carvediloL (COREG) 25 mg tablet Take one tablet by mouth twice daily with meals. Take with food.   cyanocobalamin (vitamin B-12) (VITAMIN B-12 PO) Place 1,000 mcg under tongue daily.   MAGNESIUM PO Take 470 mg by mouth daily.   NP THYROID 60 mg (1 gr) tablet Take 60 mg by mouth daily.   Omega-3-DHA-EPA-Fish Oil 1,000 mg (120 mg-180 mg) capsule Take 3 capsules by mouth daily.   rosuvastatin (CRESTOR) 10 mg tablet Take one tablet by mouth daily.   TESTOSTERONE IM Inject  into the muscle every 7 days.         Review of Systems/Medical History      Patient summary reviewed  Nursing notes reviewed  Pertinent labs reviewed    PONV Screening: Non-smoker  No history of anesthetic complications  No family history of anesthetic complications      Airway - negative          Cardiovascular       Recent diagnostic studies:          ECG, echocardiogram and stress test      Hypertension,       Dysrhythmias      Hyperlipidemia      Syncope      09/21/2017 TTE:  ? Technically difficult study; i.v. transpulmonary contrast was used to define the endocardial borders.  ? Moderately depressed left ventricular systolic function, estimated ejection fraction is 40 %, global hypokinesis.  ? Grade I (mild) left ventricular diastolic dysfunction.  ? Pacemaker lead present in the right atrium and right ventricle.  ? No significant valvular abnormalities.  ? Estimated Peak Systolic PA Pressure: 19 mmHg  ? The sinuses of Valsalva are mildly dilated.        MPI imaging report [facility] 05/17/21:  1. Global LV function is abnormal.  LVEF 45%.  2. Large mostly fixed perfusion defect involving the inferior and inferior lateral walls with evidence of peri-infarct ischemia.  EF calculated to be 45% on post-rest gated SPECT images.  With ECG changes during the exercise and recovery periods, recommend invasive diagnostic strategy.  3. Myocardial perfusion imaging abnormal.  4. 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    Constitution - negative   PHYSICAL EXAM     Diagnostic Tests  Hematology:   Lab Results   Component Value Date    HGB 17.2 03/02/2021    HCT 51.2 03/02/2021    PLTCT 195 03/02/2021    WBC 5.1 03/02/2021    NEUT 65.4 02/25/2020    ANC 3,466 02/25/2020    ALC 1,092 02/25/2020    MONA 11.9 02/25/2020    AMC 631 02/25/2020    ABC 32 02/25/2020    MCV 95.9 03/02/2021    MCH 32.2 03/02/2021    MCHC 33.6 03/02/2021    MPV 8.9 03/02/2021    RDW 12.5 03/02/2021         General Chemistry:   Lab Results   Component Value Date    NA 138 02/25/2020    K 4.3 02/25/2020    CL 103 02/25/2020    CO2 29 02/25/2020    GAP 7 08/18/2016    BUN 17 02/25/2020    CR 1.15 02/25/2020    GLU 104 02/25/2020    GLU 98 08/11/2016    CA 9.4 02/25/2020    ALBUMIN 4.0 02/25/2020    MG 2.2 08/11/2016    TOTBILI 1.0 02/25/2020      Coagulation: No results found for: PT, PTT, INR      Anesthesia Plan    ASA score: 3 Plan: MAC  Induction method: intravenous

## 2021-06-06 ENCOUNTER — Encounter: Admit: 2021-06-06 | Discharge: 2021-06-06 | Payer: MEDICARE

## 2021-06-06 DIAGNOSIS — I10 Essential (primary) hypertension: Secondary | ICD-10-CM

## 2021-06-06 DIAGNOSIS — S0990XA Unspecified injury of head, initial encounter: Secondary | ICD-10-CM

## 2021-06-06 DIAGNOSIS — R55 Syncope and collapse: Secondary | ICD-10-CM

## 2021-06-06 DIAGNOSIS — E785 Hyperlipidemia, unspecified: Secondary | ICD-10-CM

## 2021-06-06 MED ADMIN — SODIUM CHLORIDE 0.9 % IV SOLP [27838]: 500 mL | INTRAVENOUS | @ 17:00:00 | Stop: 2021-06-06 | NDC 00338004903

## 2021-06-06 MED ADMIN — SODIUM CHLORIDE 0.9 % IV SOLP [27838]: 1000.000 mL | INTRAVENOUS | @ 17:00:00 | Stop: 2021-06-07 | NDC 00338004904

## 2021-06-07 ENCOUNTER — Encounter: Admit: 2021-06-07 | Discharge: 2021-06-07 | Payer: MEDICARE

## 2021-06-07 MED ADMIN — ROSUVASTATIN 20 MG PO TAB [88504]: 10 mg | ORAL | @ 15:00:00 | NDC 68462026390

## 2021-06-07 MED ADMIN — HEPARIN (PORCINE) IN 5 % DEX 20,000 UNIT/500 ML (40 UNIT/ML) IV SOLP [3628]: 1304 [IU]/h | INTRAVENOUS | @ 19:00:00 | NDC 00264956710

## 2021-06-07 MED ADMIN — PERFLUTREN LIPID MICROSPHERES 1.1 MG/ML IV SUSP [79178]: 1.5 mL | INTRAVENOUS | @ 15:00:00 | Stop: 2021-06-07 | NDC 11994001116

## 2021-06-07 MED ADMIN — THYROID (PORK) 60 MG PO TAB [300009]: 60 mg | ORAL | @ 15:00:00 | NDC 42192033001

## 2021-06-07 MED ADMIN — MAGNESIUM OXIDE 400 MG (241.3 MG MAGNESIUM) PO TAB [10491]: 400 mg | ORAL | @ 02:00:00 | NDC 10006070028

## 2021-06-07 MED ADMIN — CARVEDILOL 25 MG PO TAB [77255]: 25 mg | ORAL | @ 02:00:00 | NDC 00904630361

## 2021-06-07 MED ADMIN — MAGNESIUM OXIDE 400 MG (241.3 MG MAGNESIUM) PO TAB [10491]: 400 mg | ORAL | @ 15:00:00 | NDC 10006070028

## 2021-06-07 MED ADMIN — ASPIRIN 81 MG PO TBEC [14113]: 81 mg | ORAL | @ 15:00:00 | NDC 00536123441

## 2021-06-07 MED ADMIN — CARVEDILOL 25 MG PO TAB [77255]: 25 mg | ORAL | @ 22:00:00 | NDC 00904630361

## 2021-06-07 MED ADMIN — CARVEDILOL 25 MG PO TAB [77255]: 25 mg | ORAL | @ 15:00:00 | NDC 00904630361

## 2021-06-07 MED ADMIN — HEPARIN (PORCINE) IN 5 % DEX 20,000 UNIT/500 ML (40 UNIT/ML) IV SOLP [3628]: 1304 [IU]/h | INTRAVENOUS | @ 03:00:00 | NDC 00264956710

## 2021-06-08 ENCOUNTER — Encounter: Admit: 2021-06-08 | Discharge: 2021-06-08 | Payer: MEDICARE

## 2021-06-08 ENCOUNTER — Inpatient Hospital Stay: Admit: 2021-06-08 | Discharge: 2021-06-08 | Payer: MEDICARE

## 2021-06-08 DIAGNOSIS — S0990XA Unspecified injury of head, initial encounter: Secondary | ICD-10-CM

## 2021-06-08 DIAGNOSIS — E785 Hyperlipidemia, unspecified: Secondary | ICD-10-CM

## 2021-06-08 DIAGNOSIS — R55 Syncope and collapse: Secondary | ICD-10-CM

## 2021-06-08 DIAGNOSIS — I10 Essential (primary) hypertension: Secondary | ICD-10-CM

## 2021-06-08 MED ORDER — CALCIUM CHLORIDE 100 MG/ML (10 %) IV SYRG
INTRAVENOUS | 0 refills | Status: DC
Start: 2021-06-08 — End: 2021-06-08
  Administered 2021-06-08: 19:00:00 .5 g via INTRAVENOUS
  Administered 2021-06-08: 19:00:00 .3 g via INTRAVENOUS
  Administered 2021-06-08: 19:00:00 .2 g via INTRAVENOUS
  Administered 2021-06-08: 19:00:00 .3 g via INTRAVENOUS
  Administered 2021-06-08: 19:00:00 .2 g via INTRAVENOUS

## 2021-06-08 MED ORDER — PROTAMINE 10 MG/ML IV SOLN
INTRAVENOUS | 0 refills | Status: DC
Start: 2021-06-08 — End: 2021-06-08
  Administered 2021-06-08: 19:00:00 30 mg via INTRAVENOUS
  Administered 2021-06-08: 19:00:00 40 mg via INTRAVENOUS
  Administered 2021-06-08: 19:00:00 50 mg via INTRAVENOUS
  Administered 2021-06-08: 19:00:00 40 mg via INTRAVENOUS
  Administered 2021-06-08: 19:00:00 30 mg via INTRAVENOUS
  Administered 2021-06-08: 19:00:00 50 mg via INTRAVENOUS

## 2021-06-08 MED ORDER — FENTANYL CITRATE (PF) 50 MCG/ML IJ SOLN
INTRAVENOUS | 0 refills | Status: DC
Start: 2021-06-08 — End: 2021-06-08
  Administered 2021-06-08 (×4): 250 ug via INTRAVENOUS

## 2021-06-08 MED ORDER — PHENYLEPHRINE 40 MCG/ML IN NS IV DRIP (STD CONC)
INTRAVENOUS | 0 refills | Status: DC
Start: 2021-06-08 — End: 2021-06-08
  Administered 2021-06-08 (×2): 1 ug/kg/min via INTRAVENOUS
  Administered 2021-06-08 (×2): .5 ug/kg/min via INTRAVENOUS

## 2021-06-08 MED ORDER — AMINOCAPROIC ACID 12.5 GM INFUSION (OR)
INTRAVENOUS | 0 refills | Status: DC
Start: 2021-06-08 — End: 2021-06-08
  Administered 2021-06-08 (×2): 2 mL/h via INTRAVENOUS

## 2021-06-08 MED ORDER — CEFAZOLIN 1 GRAM IJ SOLR
INTRAVENOUS | 0 refills | Status: DC
Start: 2021-06-08 — End: 2021-06-08
  Administered 2021-06-08 (×3): 2 g via INTRAVENOUS

## 2021-06-08 MED ORDER — AMINOCAPROIC ACID 250 MG/ML IV SOLN
INTRAVENOUS | 0 refills | Status: DC
Start: 2021-06-08 — End: 2021-06-08
  Administered 2021-06-08: 14:00:00 5 g via INTRAVENOUS

## 2021-06-08 MED ORDER — INSULIN 100UNITS NS 100ML
INTRAVENOUS | 0 refills | Status: DC
Start: 2021-06-08 — End: 2021-06-08
  Administered 2021-06-08 (×4): 2 [IU]/h via INTRAVENOUS

## 2021-06-08 MED ORDER — PROPOFOL INJ 10 MG/ML IV VIAL
INTRAVENOUS | 0 refills | Status: DC
Start: 2021-06-08 — End: 2021-06-08
  Administered 2021-06-08: 13:00:00 50 mg via INTRAVENOUS

## 2021-06-08 MED ORDER — AUTOLOGOUS BLOOD (CELL SAVER)
INTRAVENOUS | 0 refills | Status: DC
Start: 2021-06-08 — End: 2021-06-08

## 2021-06-08 MED ORDER — VECURONIUM BROMIDE 10 MG IV SOLR
INTRAVENOUS | 0 refills | Status: DC
Start: 2021-06-08 — End: 2021-06-08
  Administered 2021-06-08: 19:00:00 5 mg via INTRAVENOUS
  Administered 2021-06-08: 13:00:00 1 mg via INTRAVENOUS
  Administered 2021-06-08: 18:00:00 4 mg via INTRAVENOUS
  Administered 2021-06-08: 13:00:00 10 mg via INTRAVENOUS

## 2021-06-08 MED ORDER — VASOPRESSIN 20 UNITS IN 100ML IV INFUSION
INTRAVENOUS | 0 refills | Status: DC
Start: 2021-06-08 — End: 2021-06-08
  Administered 2021-06-08 (×2): 2.4 [IU]/h via INTRAVENOUS

## 2021-06-08 MED ORDER — MIDAZOLAM 1 MG/ML IJ SOLN
INTRAVENOUS | 0 refills | Status: CP
Start: 2021-06-08 — End: ?
  Administered 2021-06-08: 12:00:00 1 mg via INTRAVENOUS
  Administered 2021-06-08: 18:00:00 3 mg via INTRAVENOUS
  Administered 2021-06-08: 15:00:00 2 mg via INTRAVENOUS

## 2021-06-08 MED ORDER — NOREPINEPHRINE IV DRIP STD CONC (AM)(OR)
INTRAVENOUS | 0 refills | Status: DC
Start: 2021-06-08 — End: 2021-06-08
  Administered 2021-06-08: 13:00:00 .02 ug/kg/min via INTRAVENOUS

## 2021-06-08 MED ORDER — DEXMEDETOMIDINE 4 MCG/ML (INFUSION)(AM)(OR)
INTRAVENOUS | 0 refills | Status: DC
Start: 2021-06-08 — End: 2021-06-08
  Administered 2021-06-08: 19:00:00 .7 ug/kg/h via INTRAVENOUS

## 2021-06-08 MED ORDER — EPINEPHRINE 0.1 MG/ML IJ SYRG
INTRAVENOUS | 0 refills | Status: DC
Start: 2021-06-08 — End: 2021-06-08
  Administered 2021-06-08 (×2): 10 ug via INTRAVENOUS

## 2021-06-08 MED ORDER — LIDOCAINE (PF) 100 MG/5 ML (2 %) IV SYRG
INTRAVENOUS | 0 refills | Status: DC
Start: 2021-06-08 — End: 2021-06-08
  Administered 2021-06-08: 18:00:00 100 mg via INTRAVENOUS

## 2021-06-08 MED ORDER — AMIODARONE 50 MG/ML IV SOLN
INTRAVENOUS | 0 refills | Status: DC
Start: 2021-06-08 — End: 2021-06-08
  Administered 2021-06-08 (×2): 150 mg via INTRAVENOUS

## 2021-06-08 MED ORDER — VASOPRESSIN 20 UNITS/20ML SYR (1 UNIT/ML) (AN) (OSM)
INTRAVENOUS | 0 refills | Status: DC
Start: 2021-06-08 — End: 2021-06-08
  Administered 2021-06-08 (×4): 1 [IU] via INTRAVENOUS

## 2021-06-08 MED ORDER — HEPARIN (PORCINE) 1,000 UNIT/ML IJ SOLN
INTRAVENOUS | 0 refills | Status: DC
Start: 2021-06-08 — End: 2021-06-08
  Administered 2021-06-08: 15:00:00 30000 [IU] via INTRAVENOUS
  Administered 2021-06-08: 15:00:00 5000 [IU] via INTRAVENOUS

## 2021-06-08 MED ADMIN — PAPAVERINE 30 MG/ML IJ SOLN [6030]: 20 mL | @ 13:00:00 | Stop: 2021-06-08 | NDC 00517400225

## 2021-06-08 MED ADMIN — HEPARIN (PORCINE) 1,000 UNIT/ML IJ SOLN [10176]: 500 mL | @ 13:00:00 | Stop: 2021-06-08 | NDC 63323054011

## 2021-06-08 MED ADMIN — SODIUM CHLORIDE 0.9 % IV SOLP [27838]: 20 mL | @ 13:00:00 | Stop: 2021-06-08 | NDC 00264999900

## 2021-06-08 MED ADMIN — ACETAMINOPHEN 500 MG PO TAB [102]: 1000 mg | ORAL | @ 21:00:00 | Stop: 2021-06-12 | NDC 00904673061

## 2021-06-08 MED ADMIN — FENTANYL CITRATE (PF) 50 MCG/ML IJ SOLN [3037]: 50 ug | INTRAVENOUS | @ 22:00:00 | NDC 00409909412

## 2021-06-08 MED ADMIN — SODIUM BICARBONATE 1 MEQ/ML (8.4 %) IV SOLN [7309]: 100 meq | INTRAVENOUS | Stop: 2021-06-08 | NDC 00409662522

## 2021-06-08 MED ADMIN — ELECTROLYTE-A IV ADDITIVE [211674]: 500 mL | @ 13:00:00 | Stop: 2021-06-08 | NDC 00338022103

## 2021-06-08 MED ADMIN — FENTANYL CITRATE (PF) 50 MCG/ML IJ SOLN [3037]: 50 ug | INTRAVENOUS | @ 21:00:00 | NDC 00409909412

## 2021-06-08 MED ADMIN — SODIUM CHLORIDE 0.9 % IV SOLP [27838]: INTRAVENOUS | @ 11:00:00 | Stop: 2021-06-08 | NDC 00338004904

## 2021-06-08 MED ADMIN — DEXTROSE 5% IN WATER IV SOLP [2364]: 0.05 ug/kg/min | INTRAVENOUS | @ 23:00:00 | NDC 00338001702

## 2021-06-08 MED ADMIN — SODIUM CHLORIDE 0.9 % IV SOLP [27838]: 5000 mL | @ 13:00:00 | Stop: 2021-06-08 | NDC 00338004904

## 2021-06-08 MED ADMIN — MAGNESIUM SULFATE IN WATER 4 GRAM/50 ML (8 %) IV PGBK [166563]: 4 g | INTRAVENOUS | @ 22:00:00 | Stop: 2021-06-09 | NDC 00264420552

## 2021-06-08 MED ADMIN — ALBUMIN, HUMAN 5 % IV SOLP [8982]: 250 mL | INTRAVENOUS | @ 21:00:00 | Stop: 2021-06-08 | NDC 68516521403

## 2021-06-08 MED ADMIN — LIDOCAINE (PF) 10 MG/ML (1 %) IJ SOLN [95838]: 0.2 mL | INTRAMUSCULAR | @ 12:00:00 | Stop: 2021-06-08 | NDC 63323049204

## 2021-06-08 MED ADMIN — DEXMEDETOMIDINE IN 0.9 % NACL 400 MCG/100 ML (4 MCG/ML) IV SOLN [317250]: 1 ug/kg/h | INTRAVENOUS | @ 22:00:00 | NDC 00143952501

## 2021-06-08 MED ADMIN — NOREPINEPHRINE BITARTRATE-NACL 4 MG/250 ML (16 MCG/ML) IV SOLN [173409]: 0.12 ug/kg/min | INTRAVENOUS | @ 21:00:00 | NDC 70092103305

## 2021-06-08 MED ADMIN — VANCOMYCIN 1,000 MG IV SOLR [8442]: 4 g | TOPICAL | @ 13:00:00 | Stop: 2021-06-08 | NDC 00409653311

## 2021-06-08 MED ADMIN — EPINEPHRINE 1 MG/ML IJ SOLN [2850]: 0.05 ug/kg/min | INTRAVENOUS | @ 23:00:00 | NDC 42023016801

## 2021-06-08 MED ADMIN — CARVEDILOL 25 MG PO TAB [77255]: 25 mg | ORAL | @ 11:00:00 | Stop: 2021-06-08 | NDC 00904630361

## 2021-06-09 ENCOUNTER — Inpatient Hospital Stay: Admit: 2021-06-09 | Discharge: 2021-06-09 | Payer: MEDICARE

## 2021-06-09 MED ADMIN — ALBUMIN, HUMAN 5 % IV SOLP [8982]: 250 mL | INTRAVENOUS | @ 11:00:00 | Stop: 2021-06-09 | NDC 68982062302

## 2021-06-09 MED ADMIN — CALCIUM CHLORIDE 100 MG/ML (10 %) IV SYRG [85730]: 1 g | INTRAVENOUS | @ 12:00:00 | Stop: 2021-06-09 | NDC 00409492811

## 2021-06-09 MED ADMIN — DEXMEDETOMIDINE IN 0.9 % NACL 400 MCG/100 ML (4 MCG/ML) IV SOLN [317250]: 1 ug/kg/h | INTRAVENOUS | @ 04:00:00 | NDC 00143952501

## 2021-06-09 MED ADMIN — ACETAMINOPHEN 500 MG PO TAB [102]: 1000 mg | ORAL | @ 13:00:00 | Stop: 2021-06-12 | NDC 00904673061

## 2021-06-09 MED ADMIN — PANTOPRAZOLE 40 MG IV SOLR [78621]: 40 mg | INTRAVENOUS | @ 01:00:00 | NDC 55150020200

## 2021-06-09 MED ADMIN — POTASSIUM CHLORIDE IN WATER 10 MEQ/50 ML IV PGBK [11075]: 10 meq | INTRAVENOUS | @ 11:00:00 | Stop: 2022-06-08 | NDC 00338070541

## 2021-06-09 MED ADMIN — MIDAZOLAM 1 MG/ML IJ SOLN [10607]: 4 mg | INTRAVENOUS | @ 04:00:00 | Stop: 2021-06-09 | NDC 00409230516

## 2021-06-09 MED ADMIN — SENNOSIDES-DOCUSATE SODIUM 8.6-50 MG PO TAB [40926]: 2 | ORAL | @ 13:00:00 | NDC 70000052601

## 2021-06-09 MED ADMIN — ACETAMINOPHEN 1,000 MG/100 ML (10 MG/ML) IV SOLN [305632]: 1000 mg | INTRAVENOUS | @ 02:00:00 | Stop: 2021-06-09 | NDC 67457094010

## 2021-06-09 MED ADMIN — VASOPRESSIN 0.2 UNIT/ML IV SOLN [458566]: 2.4 [IU]/h | INTRAVENOUS | @ 12:00:00 | NDC 42023023701

## 2021-06-09 MED ADMIN — ALBUMIN, HUMAN 5 % IV SOLP [8982]: 250 mL | INTRAVENOUS | @ 21:00:00 | Stop: 2021-06-09 | NDC 68982062302

## 2021-06-09 MED ADMIN — NICARDIPINE IN NACL (ISO-OS) 20 MG/200 ML (0.1 MG/ML) IV PGBK [169819]: 2.5 mg/h | INTRAVENOUS | @ 02:00:00 | NDC 43066000910

## 2021-06-09 MED ADMIN — ASPIRIN 81 MG PO CHEW [680]: 81 mg | ORAL | @ 13:00:00 | NDC 66553000201

## 2021-06-09 MED ADMIN — FENTANYL CITRATE (PF) 50 MCG/ML IJ SOLN [3037]: 50 ug | INTRAVENOUS | @ 01:00:00 | NDC 00409909412

## 2021-06-09 MED ADMIN — MIDAZOLAM 1 MG/ML IJ SOLN [10607]: 2 mg | INTRAVENOUS | @ 12:00:00 | Stop: 2021-06-09 | NDC 00409230516

## 2021-06-09 MED ADMIN — ALBUMIN, HUMAN 5 % IV SOLP [8982]: 250 mL | INTRAVENOUS | @ 04:00:00 | Stop: 2021-06-09 | NDC 68516521401

## 2021-06-09 MED ADMIN — ALBUMIN, HUMAN 5 % IV SOLP [8982]: 250 mL | INTRAVENOUS | @ 17:00:00 | Stop: 2021-06-09 | NDC 68982062302

## 2021-06-09 MED ADMIN — AMIODARONE 200 MG PO TAB [9066]: 400 mg | ORAL | @ 13:00:00 | NDC 00904699361

## 2021-06-09 MED ADMIN — SODIUM CHLORIDE 0.9 % IV SOLP [27838]: 1000.000 mL | INTRAVENOUS | @ 16:00:00 | Stop: 2021-06-10 | NDC 00338004904

## 2021-06-09 MED ADMIN — DEXMEDETOMIDINE IN 0.9 % NACL 400 MCG/100 ML (4 MCG/ML) IV SOLN [317250]: 1 ug/kg/h | INTRAVENOUS | @ 09:00:00 | Stop: 2021-06-09 | NDC 00143952501

## 2021-06-09 MED ADMIN — ACETAMINOPHEN 500 MG PO TAB [102]: 1000 mg | ORAL | @ 20:00:00 | Stop: 2021-06-12 | NDC 00904673061

## 2021-06-09 MED ADMIN — ROSUVASTATIN 10 MG PO TAB [88503]: 10 mg | ORAL | @ 13:00:00 | NDC 00904677961

## 2021-06-09 MED ADMIN — FENTANYL CITRATE (PF) 50 MCG/ML IJ SOLN [3037]: 100 ug | INTRAVENOUS | @ 12:00:00 | NDC 00409909412

## 2021-06-09 MED ADMIN — ALBUMIN, HUMAN 5 % IV SOLP [8982]: 250 mL | INTRAVENOUS | @ 19:00:00 | Stop: 2021-06-09 | NDC 68982062302

## 2021-06-09 MED ADMIN — THYROID (PORK) 60 MG PO TAB [300009]: 60 mg | ORAL | @ 12:00:00 | NDC 42192033001

## 2021-06-09 MED ADMIN — ALBUMIN, HUMAN 5 % IV SOLP [8982]: 250 mL | INTRAVENOUS | @ 08:00:00 | Stop: 2021-06-09 | NDC 68516521403

## 2021-06-10 ENCOUNTER — Inpatient Hospital Stay: Admit: 2021-06-10 | Discharge: 2021-06-10 | Payer: MEDICARE

## 2021-06-10 ENCOUNTER — Encounter: Admit: 2021-06-10 | Discharge: 2021-06-10 | Payer: MEDICARE

## 2021-06-10 DIAGNOSIS — S0990XA Unspecified injury of head, initial encounter: Secondary | ICD-10-CM

## 2021-06-10 DIAGNOSIS — I10 Essential (primary) hypertension: Secondary | ICD-10-CM

## 2021-06-10 DIAGNOSIS — R55 Syncope and collapse: Secondary | ICD-10-CM

## 2021-06-10 DIAGNOSIS — E785 Hyperlipidemia, unspecified: Secondary | ICD-10-CM

## 2021-06-10 MED ADMIN — SENNOSIDES-DOCUSATE SODIUM 8.6-50 MG PO TAB [40926]: 2 | ORAL | @ 13:00:00 | NDC 70000052601

## 2021-06-10 MED ADMIN — SENNOSIDES-DOCUSATE SODIUM 8.6-50 MG PO TAB [40926]: 2 | ORAL | @ 01:00:00 | NDC 70000052601

## 2021-06-10 MED ADMIN — MAGNESIUM SULFATE IN D5W 1 GRAM/100 ML IV PGBK [166578]: 1 g | INTRAVENOUS | @ 11:00:00 | Stop: 2021-06-10 | NDC 00338170940

## 2021-06-10 MED ADMIN — AMIODARONE 200 MG PO TAB [9066]: 400 mg | ORAL | @ 01:00:00 | NDC 00904699361

## 2021-06-10 MED ADMIN — ACETAMINOPHEN 500 MG PO TAB [102]: 1000 mg | ORAL | @ 01:00:00 | Stop: 2021-06-12 | NDC 00904673061

## 2021-06-10 MED ADMIN — POLYETHYLENE GLYCOL 3350 17 GRAM PO PWPK [25424]: 17 g | ORAL | @ 22:00:00 | NDC 00904693186

## 2021-06-10 MED ADMIN — THYROID (PORK) 60 MG PO TAB [300009]: 60 mg | ORAL | @ 11:00:00 | NDC 42192033001

## 2021-06-10 MED ADMIN — OXYCODONE 5 MG PO TAB [10814]: 5 mg | ORAL | NDC 00904696661

## 2021-06-10 MED ADMIN — OXYCODONE 5 MG PO TAB [10814]: 5 mg | ORAL | @ 11:00:00 | NDC 00904696661

## 2021-06-10 MED ADMIN — ASPIRIN 81 MG PO CHEW [680]: 81 mg | ORAL | @ 13:00:00 | NDC 66553000201

## 2021-06-10 MED ADMIN — DIAZEPAM 5 MG PO TAB [2405]: 5 mg | ORAL | @ 06:00:00 | Stop: 2021-06-10 | NDC 51079028501

## 2021-06-10 MED ADMIN — POTASSIUM CHLORIDE 20 MEQ PO TBTQ [35943]: 20 meq | ORAL | @ 11:00:00 | Stop: 2022-06-08 | NDC 68084036011

## 2021-06-10 MED ADMIN — AMIODARONE 200 MG PO TAB [9066]: 400 mg | ORAL | @ 13:00:00 | NDC 00904699361

## 2021-06-10 MED ADMIN — DEXTROSE 5% IN WATER IV SOLP [2364]: 0.03 ug/kg/min | INTRAVENOUS | @ 08:00:00 | Stop: 2021-06-10 | NDC 00338001702

## 2021-06-10 MED ADMIN — ACETAMINOPHEN 500 MG PO TAB [102]: 1000 mg | ORAL | @ 13:00:00 | Stop: 2021-06-12 | NDC 00904673061

## 2021-06-10 MED ADMIN — ROSUVASTATIN 10 MG PO TAB [88503]: 10 mg | ORAL | @ 13:00:00 | NDC 00904677961

## 2021-06-10 MED ADMIN — EPINEPHRINE 1 MG/ML IJ SOLN [2850]: 0.03 ug/kg/min | INTRAVENOUS | @ 08:00:00 | Stop: 2021-06-10 | NDC 76329906000

## 2021-06-10 MED ADMIN — ACETAMINOPHEN 500 MG PO TAB [102]: 1000 mg | ORAL | @ 19:00:00 | Stop: 2021-06-12 | NDC 00904673061

## 2021-06-10 MED ADMIN — MAGNESIUM SULFATE IN D5W 1 GRAM/100 ML IV PGBK [166578]: 1 g | INTRAVENOUS | @ 13:00:00 | Stop: 2021-06-24 | NDC 00409672723

## 2021-06-11 ENCOUNTER — Inpatient Hospital Stay: Admit: 2021-06-11 | Discharge: 2021-06-11 | Payer: MEDICARE

## 2021-06-11 MED ADMIN — SENNOSIDES-DOCUSATE SODIUM 8.6-50 MG PO TAB [40926]: 2 | ORAL | @ 03:00:00 | NDC 70000052601

## 2021-06-11 MED ADMIN — ACETAMINOPHEN 500 MG PO TAB [102]: 1000 mg | ORAL | @ 14:00:00 | Stop: 2021-06-12 | NDC 00904673061

## 2021-06-11 MED ADMIN — AMIODARONE 200 MG PO TAB [9066]: 400 mg | ORAL | @ 14:00:00 | NDC 00904699361

## 2021-06-11 MED ADMIN — SENNOSIDES-DOCUSATE SODIUM 8.6-50 MG PO TAB [40926]: 2 | ORAL | @ 14:00:00 | NDC 70000052601

## 2021-06-11 MED ADMIN — NALOXEGOL 25 MG PO TAB [324273]: 25 mg | ORAL | @ 20:00:00 | Stop: 2021-06-11 | NDC 57841130101

## 2021-06-11 MED ADMIN — POTASSIUM CHLORIDE 20 MEQ PO TBTQ [35943]: 20 meq | ORAL | @ 11:00:00 | Stop: 2022-06-08 | NDC 00245531989

## 2021-06-11 MED ADMIN — FUROSEMIDE 10 MG/ML IJ SOLN [3291]: 20 mg | INTRAVENOUS | @ 14:00:00 | NDC 63323028001

## 2021-06-11 MED ADMIN — FUROSEMIDE 10 MG/ML IJ SOLN [3291]: 20 mg | INTRAVENOUS | @ 23:00:00 | NDC 63323028001

## 2021-06-11 MED ADMIN — ASPIRIN 81 MG PO CHEW [680]: 81 mg | ORAL | @ 14:00:00 | NDC 66553000201

## 2021-06-11 MED ADMIN — OXYCODONE 5 MG PO TAB [10814]: 5 mg | ORAL | @ 01:00:00 | NDC 00904696661

## 2021-06-11 MED ADMIN — ACETAMINOPHEN 500 MG PO TAB [102]: 1000 mg | ORAL | @ 20:00:00 | Stop: 2021-06-12 | NDC 00904673061

## 2021-06-11 MED ADMIN — ACETAMINOPHEN 500 MG PO TAB [102]: 1000 mg | ORAL | @ 03:00:00 | Stop: 2021-06-12 | NDC 00904673061

## 2021-06-11 MED ADMIN — HEPARIN, PORCINE (PF) 5,000 UNIT/0.5 ML IJ SYRG [95535]: 5000 [IU] | SUBCUTANEOUS | @ 20:00:00 | NDC 00409131611

## 2021-06-11 MED ADMIN — METOPROLOL TARTRATE 25 MG PO TAB [37637]: 12.5 mg | ORAL | @ 14:00:00 | NDC 62584026511

## 2021-06-11 MED ADMIN — POLYETHYLENE GLYCOL 3350 17 GRAM PO PWPK [25424]: 17 g | ORAL | @ 03:00:00 | NDC 00904693186

## 2021-06-11 MED ADMIN — POLYETHYLENE GLYCOL 3350 17 GRAM PO PWPK [25424]: 17 g | ORAL | @ 14:00:00 | NDC 00904693186

## 2021-06-11 MED ADMIN — MAGNESIUM OXIDE 400 MG (241.3 MG MAGNESIUM) PO TAB [10491]: 200 mg | ORAL | @ 11:00:00 | Stop: 2021-06-24 | NDC 10006070028

## 2021-06-11 MED ADMIN — THYROID (PORK) 60 MG PO TAB [300009]: 60 mg | ORAL | @ 12:00:00 | NDC 42192033001

## 2021-06-11 MED ADMIN — ROSUVASTATIN 10 MG PO TAB [88503]: 10 mg | ORAL | @ 14:00:00 | NDC 00904677961

## 2021-06-11 MED ADMIN — AMIODARONE 200 MG PO TAB [9066]: 400 mg | ORAL | @ 03:00:00 | NDC 00904699361

## 2021-06-12 ENCOUNTER — Inpatient Hospital Stay: Admit: 2021-06-12 | Discharge: 2021-06-12 | Payer: MEDICARE

## 2021-06-12 MED ADMIN — FUROSEMIDE 10 MG/ML IJ SOLN [3291]: 40 mg | INTRAVENOUS | @ 14:00:00 | NDC 70860030242

## 2021-06-12 MED ADMIN — METOPROLOL TARTRATE 25 MG PO TAB [37637]: 12.5 mg | ORAL | @ 14:00:00 | Stop: 2021-06-12 | NDC 62584026511

## 2021-06-12 MED ADMIN — FUROSEMIDE 10 MG/ML IJ SOLN [3291]: 40 mg | INTRAVENOUS | @ 22:00:00 | NDC 70860030242

## 2021-06-12 MED ADMIN — HEPARIN, PORCINE (PF) 5,000 UNIT/0.5 ML IJ SYRG [95535]: 5000 [IU] | SUBCUTANEOUS | @ 20:00:00 | NDC 00409131611

## 2021-06-12 MED ADMIN — METOPROLOL TARTRATE 25 MG PO TAB [37637]: 12.5 mg | ORAL | @ 01:00:00 | NDC 62584026511

## 2021-06-12 MED ADMIN — HEPARIN, PORCINE (PF) 5,000 UNIT/0.5 ML IJ SYRG [95535]: 5000 [IU] | SUBCUTANEOUS | @ 12:00:00 | NDC 00409131611

## 2021-06-12 MED ADMIN — POTASSIUM CHLORIDE 20 MEQ PO TBTQ [35943]: 20 meq | ORAL | @ 16:00:00 | Stop: 2022-06-08 | NDC 00245531989

## 2021-06-12 MED ADMIN — HEPARIN, PORCINE (PF) 5,000 UNIT/0.5 ML IJ SYRG [95535]: 5000 [IU] | SUBCUTANEOUS | @ 03:00:00 | NDC 00409131611

## 2021-06-12 MED ADMIN — ROSUVASTATIN 10 MG PO TAB [88503]: 10 mg | ORAL | @ 14:00:00 | NDC 00904677961

## 2021-06-12 MED ADMIN — DEXTROSE 5% IN WATER IV SOLP [2364]: 150 mg | INTRAVENOUS | @ 17:00:00 | Stop: 2021-06-12 | NDC 00338001738

## 2021-06-12 MED ADMIN — ASPIRIN 81 MG PO CHEW [680]: 81 mg | ORAL | @ 14:00:00 | NDC 66553000201

## 2021-06-12 MED ADMIN — AMIODARONE 50 MG/ML IV SOLN [9065]: 150 mg | INTRAVENOUS | @ 17:00:00 | Stop: 2021-06-12 | NDC 00143987501

## 2021-06-12 MED ADMIN — AMIODARONE 200 MG PO TAB [9066]: 400 mg | ORAL | @ 14:00:00 | NDC 00904699361

## 2021-06-12 MED ADMIN — SIMETHICONE 80 MG PO CHEW [7227]: 160 mg | ORAL | @ 14:00:00 | NDC 70000043401

## 2021-06-12 MED ADMIN — ACETAMINOPHEN 500 MG PO TAB [102]: 1000 mg | ORAL | @ 01:00:00 | Stop: 2021-06-12 | NDC 00904673061

## 2021-06-12 MED ADMIN — AMIODARONE 200 MG PO TAB [9066]: 400 mg | ORAL | @ 01:00:00 | NDC 00904699361

## 2021-06-12 MED ADMIN — ACETAMINOPHEN 500 MG PO TAB [102]: 1000 mg | ORAL | @ 14:00:00 | Stop: 2021-06-12 | NDC 00904673061

## 2021-06-12 MED ADMIN — THYROID (PORK) 60 MG PO TAB [300009]: 60 mg | ORAL | @ 12:00:00 | NDC 42192033001

## 2021-06-12 MED ADMIN — MAGNESIUM OXIDE 400 MG (241.3 MG MAGNESIUM) PO TAB [10491]: 400 mg | ORAL | @ 16:00:00 | Stop: 2021-06-24 | NDC 10006070028

## 2021-06-13 ENCOUNTER — Inpatient Hospital Stay: Admit: 2021-06-13 | Discharge: 2021-06-13 | Payer: MEDICARE

## 2021-06-13 MED ADMIN — MAGNESIUM OXIDE 400 MG (241.3 MG MAGNESIUM) PO TAB [10491]: 200 mg | ORAL | @ 11:00:00 | Stop: 2021-06-24 | NDC 10006070028

## 2021-06-13 MED ADMIN — POTASSIUM CHLORIDE 20 MEQ PO TBTQ [35943]: 40 meq | ORAL | @ 11:00:00 | Stop: 2022-06-08 | NDC 00245531989

## 2021-06-13 MED ADMIN — METOPROLOL TARTRATE 25 MG PO TAB [37637]: 25 mg | ORAL | @ 01:00:00 | NDC 62584026511

## 2021-06-13 MED ADMIN — ROSUVASTATIN 10 MG PO TAB [88503]: 10 mg | ORAL | @ 13:00:00 | NDC 00904677961

## 2021-06-13 MED ADMIN — METOPROLOL TARTRATE 50 MG PO TAB [5009]: 50 mg | ORAL | @ 13:00:00 | NDC 62584026611

## 2021-06-13 MED ADMIN — AMIODARONE 200 MG PO TAB [9066]: 400 mg | ORAL | @ 01:00:00 | NDC 00904699361

## 2021-06-13 MED ADMIN — HEPARIN, PORCINE (PF) 5,000 UNIT/0.5 ML IJ SYRG [95535]: 5000 [IU] | SUBCUTANEOUS | @ 02:00:00 | NDC 00409131611

## 2021-06-13 MED ADMIN — FUROSEMIDE 10 MG/ML IJ SOLN [3291]: 40 mg | INTRAVENOUS | @ 23:00:00 | NDC 70860030242

## 2021-06-13 MED ADMIN — HEPARIN, PORCINE (PF) 5,000 UNIT/0.5 ML IJ SYRG [95535]: 5000 [IU] | SUBCUTANEOUS | @ 11:00:00 | NDC 00409131611

## 2021-06-13 MED ADMIN — THYROID (PORK) 60 MG PO TAB [300009]: 60 mg | ORAL | @ 11:00:00 | NDC 42192033001

## 2021-06-13 MED ADMIN — AMIODARONE 200 MG PO TAB [9066]: 400 mg | ORAL | @ 13:00:00 | NDC 00904699361

## 2021-06-13 MED ADMIN — SIMETHICONE 80 MG PO CHEW [7227]: 160 mg | ORAL | @ 01:00:00 | NDC 70000043401

## 2021-06-13 MED ADMIN — ASPIRIN 81 MG PO CHEW [680]: 81 mg | ORAL | @ 13:00:00 | NDC 66553000201

## 2021-06-13 MED ADMIN — HEPARIN, PORCINE (PF) 5,000 UNIT/0.5 ML IJ SYRG [95535]: 5000 [IU] | SUBCUTANEOUS | @ 20:00:00 | NDC 00409131611

## 2021-06-13 MED ADMIN — FUROSEMIDE 10 MG/ML IJ SOLN [3291]: 40 mg | INTRAVENOUS | @ 13:00:00 | NDC 70860030242

## 2021-06-14 ENCOUNTER — Encounter: Admit: 2021-06-14 | Discharge: 2021-06-14 | Payer: MEDICARE

## 2021-06-14 MED ADMIN — AMIODARONE 200 MG PO TAB [9066]: 400 mg | ORAL | @ 01:00:00 | NDC 51672402504

## 2021-06-14 MED ADMIN — METOPROLOL TARTRATE 50 MG PO TAB [5009]: 50 mg | ORAL | @ 13:00:00 | Stop: 2021-06-14 | NDC 62584026611

## 2021-06-14 MED ADMIN — POTASSIUM CHLORIDE 20 MEQ PO TBTQ [35943]: 40 meq | ORAL | @ 11:00:00 | Stop: 2021-06-14 | NDC 00245531989

## 2021-06-14 MED ADMIN — AMIODARONE 200 MG PO TAB [9066]: 400 mg | ORAL | @ 13:00:00 | Stop: 2021-06-14 | NDC 00904699361

## 2021-06-14 MED ADMIN — MAGNESIUM OXIDE 400 MG (241.3 MG MAGNESIUM) PO TAB [10491]: 400 mg | ORAL | @ 11:00:00 | Stop: 2021-06-14 | NDC 10006070028

## 2021-06-14 MED ADMIN — FUROSEMIDE 10 MG/ML IJ SOLN [3291]: 40 mg | INTRAVENOUS | @ 13:00:00 | Stop: 2021-06-14 | NDC 70860030242

## 2021-06-14 MED ADMIN — SIMETHICONE 80 MG PO CHEW [7227]: 160 mg | ORAL | @ 13:00:00 | Stop: 2021-06-14 | NDC 70000043401

## 2021-06-14 MED ADMIN — ASPIRIN 81 MG PO CHEW [680]: 81 mg | ORAL | @ 13:00:00 | Stop: 2021-06-14 | NDC 66553000201

## 2021-06-14 MED ADMIN — METOPROLOL TARTRATE 50 MG PO TAB [5009]: 50 mg | ORAL | @ 01:00:00 | NDC 62584026611

## 2021-06-14 MED ADMIN — ROSUVASTATIN 10 MG PO TAB [88503]: 10 mg | ORAL | @ 13:00:00 | Stop: 2021-06-14 | NDC 00904677961

## 2021-06-14 MED ADMIN — HEPARIN, PORCINE (PF) 5,000 UNIT/0.5 ML IJ SYRG [95535]: 5000 [IU] | SUBCUTANEOUS | @ 11:00:00 | Stop: 2021-06-14 | NDC 00409131611

## 2021-06-14 MED ADMIN — THYROID (PORK) 60 MG PO TAB [300009]: 60 mg | ORAL | @ 13:00:00 | Stop: 2021-06-14 | NDC 42192033001

## 2021-06-14 MED ADMIN — HEPARIN, PORCINE (PF) 5,000 UNIT/0.5 ML IJ SYRG [95535]: 5000 [IU] | SUBCUTANEOUS | @ 04:00:00 | NDC 00409131611

## 2021-06-14 MED ADMIN — SENNOSIDES-DOCUSATE SODIUM 8.6-50 MG PO TAB [40926]: 2 | ORAL | @ 13:00:00 | Stop: 2021-06-14 | NDC 70000052601

## 2021-06-14 MED FILL — FUROSEMIDE 40 MG PO TAB: 40 mg | ORAL | 7 days supply | Qty: 7 | Fill #1 | Status: CP

## 2021-06-14 MED FILL — AMIODARONE 200 MG PO TAB: 200 mg | ORAL | 58 days supply | Qty: 114 | Fill #1 | Status: CP

## 2021-06-14 MED FILL — METOPROLOL TARTRATE 50 MG PO TAB: 50 mg | ORAL | 90 days supply | Qty: 180 | Fill #1 | Status: CP

## 2021-06-14 MED FILL — POTASSIUM CHLORIDE 20 MEQ PO TBTQ: 20 mEq | ORAL | 7 days supply | Qty: 7 | Fill #1 | Status: CP

## 2021-06-17 ENCOUNTER — Encounter: Admit: 2021-06-17 | Discharge: 2021-06-17 | Payer: MEDICARE

## 2021-07-12 ENCOUNTER — Ambulatory Visit: Admit: 2021-07-12 | Discharge: 2021-07-12 | Payer: MEDICARE

## 2021-07-12 ENCOUNTER — Encounter: Admit: 2021-07-12 | Discharge: 2021-07-12 | Payer: MEDICARE

## 2021-07-12 DIAGNOSIS — Z951 Presence of aortocoronary bypass graft: Secondary | ICD-10-CM

## 2021-07-12 DIAGNOSIS — Z9889 Other specified postprocedural states: Secondary | ICD-10-CM

## 2021-07-13 ENCOUNTER — Ambulatory Visit: Admit: 2021-07-13 | Discharge: 2021-07-13 | Payer: MEDICARE

## 2021-07-13 ENCOUNTER — Encounter: Admit: 2021-07-13 | Discharge: 2021-07-13 | Payer: MEDICARE

## 2021-07-13 DIAGNOSIS — R55 Syncope and collapse: Secondary | ICD-10-CM

## 2021-07-13 DIAGNOSIS — E785 Hyperlipidemia, unspecified: Secondary | ICD-10-CM

## 2021-07-13 DIAGNOSIS — Z950 Presence of cardiac pacemaker: Secondary | ICD-10-CM

## 2021-07-13 DIAGNOSIS — I10 Essential (primary) hypertension: Secondary | ICD-10-CM

## 2021-07-13 DIAGNOSIS — Z951 Presence of aortocoronary bypass graft: Secondary | ICD-10-CM

## 2021-07-13 DIAGNOSIS — I2583 Coronary atherosclerosis due to lipid rich plaque: Secondary | ICD-10-CM

## 2021-07-13 DIAGNOSIS — I4891 Unspecified atrial fibrillation: Principal | ICD-10-CM

## 2021-07-13 DIAGNOSIS — S0990XA Unspecified injury of head, initial encounter: Secondary | ICD-10-CM

## 2021-07-13 DIAGNOSIS — I441 Atrioventricular block, second degree: Secondary | ICD-10-CM

## 2021-07-13 DIAGNOSIS — I2510 Atherosclerotic heart disease of native coronary artery without angina pectoris: Secondary | ICD-10-CM

## 2021-07-13 MED ORDER — METOPROLOL TARTRATE 25 MG PO TAB
25 mg | ORAL_TABLET | Freq: Two times a day (BID) | ORAL | 1 refills | 90.00000 days | Status: AC
Start: 2021-07-13 — End: ?

## 2021-07-13 NOTE — Patient Instructions
Your EKG looks unchanged, today rhythm is sinus (normal)    We have reduced your metoprolol from 50-->25mg  twice daily, sent to [facility].  This should allow BP to elevated by 5-10 mmHg    Keep up with rehab    Augusto Gamble will see you in 3 months, I will see you in 6 months.  We will do an echo when I see you     I work with a wonderful group of nurses that are part of the "purple" team.  There is a shared phone number for this group of nurses, [phone].  Please contact this number should you have any questions about your visit today or concerns about upcoming appointments/procedures.  We would like to know if you are having any worrisome cardiac symptoms.     Please call [phone] if needing to schedule an office visit.     We are also available by e-mail through the MyChart system if you have non-urgent concerns. We will get back to you as soon as possible. For urgent or after hours needs, please call [phone].

## 2021-08-23 ENCOUNTER — Encounter: Admit: 2021-08-23 | Discharge: 2021-08-23 | Payer: MEDICARE

## 2021-08-26 ENCOUNTER — Encounter: Admit: 2021-08-26 | Discharge: 2021-08-26 | Payer: MEDICARE

## 2021-08-26 NOTE — Telephone Encounter
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 Re: lead safety margin, do you want pt scheduled for in office check sooner in [place] or do you think he's ok to wait until April as planned?  Please msg purple team back with recommendations. Thanks!      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, 10/11 remote shows ~3hrs of AF from 8/4-8/11 and very little since that time(several short episodes triggered by his device showing SR w/frequent PACs but no sustained episodes since August)    Notably his RA and RV lead trends show sensing and impedance in both RA and RV leads has been trending down since ~06/2021 and RV now has barely a 1x safety margin for sensing-->his pacing% has not trended up but it may be better to bring him in for an in-office device check and assess leads to ensure appropriate safety margins.(This is likely non-urgent and probably ok to keep an eye on via remote if he is feeling ok but his next check isn't expected until~02/2022 and that is a ways out) please follow-up as needed  Thanks,  -Kaylee/Device Team

## 2021-11-10 ENCOUNTER — Encounter: Admit: 2021-11-10 | Discharge: 2021-11-10 | Payer: MEDICARE

## 2021-11-16 ENCOUNTER — Encounter: Admit: 2021-11-16 | Discharge: 2021-11-16 | Payer: MEDICARE

## 2021-11-16 ENCOUNTER — Ambulatory Visit: Admit: 2021-11-16 | Discharge: 2021-11-16 | Payer: MEDICARE

## 2021-11-16 DIAGNOSIS — I441 Atrioventricular block, second degree: Secondary | ICD-10-CM

## 2021-11-16 DIAGNOSIS — I4891 Unspecified atrial fibrillation: Secondary | ICD-10-CM

## 2021-11-16 DIAGNOSIS — Z951 Presence of aortocoronary bypass graft: Secondary | ICD-10-CM

## 2021-11-16 DIAGNOSIS — E785 Hyperlipidemia, unspecified: Secondary | ICD-10-CM

## 2021-11-16 DIAGNOSIS — I5032 Chronic diastolic (congestive) heart failure: Secondary | ICD-10-CM

## 2021-11-16 DIAGNOSIS — R55 Syncope and collapse: Secondary | ICD-10-CM

## 2021-11-16 DIAGNOSIS — E782 Mixed hyperlipidemia: Secondary | ICD-10-CM

## 2021-11-16 DIAGNOSIS — S0990XA Unspecified injury of head, initial encounter: Secondary | ICD-10-CM

## 2021-11-16 DIAGNOSIS — I10 Essential (primary) hypertension: Secondary | ICD-10-CM

## 2021-11-16 DIAGNOSIS — Z9889 Other specified postprocedural states: Secondary | ICD-10-CM

## 2021-11-16 DIAGNOSIS — I2510 Atherosclerotic heart disease of native coronary artery without angina pectoris: Secondary | ICD-10-CM

## 2021-11-16 DIAGNOSIS — Z950 Presence of cardiac pacemaker: Secondary | ICD-10-CM

## 2021-11-16 NOTE — Progress Notes
Date of Service: 11/16/2021    Wesley Stanley is a 71 y.o. male.       HPI       I had the pleasure of seeing Wesley Stanley for a follow up visit in the Cardiovascular Medicine clinic at [facility].     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    Wesley Stanley was seen in July with decreased exercise tolerance and chest pain.  He had an echocardiogram on 06/07/2021 showing preserved LV function with EF of 60%, no significant valvular disease.  His myocardial perfusion stress test at facility in [place] showed an enlarged fixed perfusion defect in the inferior and inferior lateral walls.  He underwent subsequent cardiac catheterization on 06/08/21 revealing triple-vessel disease involving the 99% left main.  CTS was consulted undergoing revascularization on 06/25/2021.      He was seen by Dr. Neale Burly on 07/13/2021 post hospital follow-up and was doing well from a cardiac perspective.  He remained on amiodarone from for brief postop atrial fibrillation.  Amiodarone was tapered over 4 weeks and discontinued.   We continue to monitor his A. fib burden through device interrogations.  His lipid panel in August showed goal LDL of 64 on rosuvastatin 10 mg.  He had a remote device interrogation on 08/23/2021 showing undersensing of the atrial leads with 3 years of atrial fibrillation from 8/4 through 06/16/2021 without recurrence.  He had underlying sinus rhythm with frequent PACs.  His RA and RV leads have showed decreased sensing and impedance.     Wesley Stanley has done well from a cardiac perspective.  He tries to get at least 3 to 4 miles in daily of walking without exertional symptoms.  He is on a intermittent fasting eating 1 meal every other day, 2 mL the other days.  He had purchased his garment shortly after surgery and has noticed increased heart rates occasionally up to 115s over the past 2 months.  He denies associated palpitations, presyncope or syncopal episodes.           Vitals:    11/16/21 0955   BP: 102/70   BP Source: Arm, Left Upper   Pulse: 73   SpO2: 96%   PainSc: Zero   Weight: 87.1 kg (192 lb)   Height: 177.8 cm (5' 10)     Body mass index is 27.55 kg/m?[name]     Past Medical History  Patient Active Problem List    Diagnosis Date Noted   ? S/P left atrial appendage ligation 06/12/2021     06/07/21 - Atriclip - Almoghrabi     ? Chronic diastolic heart failure, NYHA class 2 (HCC) 06/12/2021   ? Volume overload 06/12/2021   ? S/P CABG x 6 06/09/2021     06/08/21 - s/p CABG/Atriclip - Almoghrabi ((LIMA to the LAD, rSVG to PDA, rSVGto PLA, rSVG to D1 and sequential rSVG to OM1/OM2)     ? Acute blood loss anemia 06/08/2021   ? Coronary artery disease involving native coronary artery of native heart without angina pectoris 06/06/2021       07/24/16- Treadmill Exercise Echo: No new regional wall motion abnormalities. Global left ventricular systolic function becomes hyperdynamic. Left ventricular ejection  fraction increases and left ventricular volume decreases. ECG changes at peak exercise (horizontal ST segment depression) suggestive of ischemia, with normal LV systolic function; No regional wall motion abnormalities seen.  03/15/09- Bruce Treadmill Thallium MPI: LVEF 48%. This study suggests the low probability of any obstructive coronary disease. No regions of ischemic myocardium are appreciated at this level of exercise. No high risk markers are noted. Left ventricular function is borderline low with an EF of 48% and mild diffuse hypokinesis. No high risk markers are appreciated on this study. We have no prior studies for comparison.  08/30/16- Regadenoson Thallium MPI:  LVEF 40%.  There is no evidence of significant myocardial ischemia.  Lung uptake of radiotracer was within normal limits.  The ECG portion of the study is negative for ischemia.  01/03/17- Echo: LVEF 45%. No significant valve disease is identified. Comparison is made to prior study of 07/24/16, there has been very slight drop in LV function, EF previously ~ 50%.   10/05/17- Limited Echo: LVEF 40%. Moderately depressed left ventricular systolic function. Grade I (mild) left ventricular diastolic dysfunction. No significant valvular abnormalities. The sinuses of Valsalva are mildly dilated.  05/17/21- Bruce Treadmill Thallium MPI [facility] [place], [place]): LVEF 45%. Large mostly fixed perfusion defect involving the Inferior and Inferolateral walls with evidence of peri-infarct ischemia. With EKG changes during the exercise and recovery periods, I recommend invasive diagnostic. High risk study, myocardial perfusion imaging is abnormal. Pt set up for Left Heart Cath  06/06/21- Cardiac Cath: 3-vessel calcified coronary artery disease with left main 99% stenosis with normal LVEDP without aortic stenosis. Ordered a cardiothoracic surgical consultation for possible bypass grafting.  06/06/21- Carotid Duplex: There is mild atheromatous plaque visualized in bilateral common and internal carotid arteries. No hemodynamically significant (>50%) stenosis measured in?the common and internal carotid arteries bilaterally  06/07/21- Echo: LVEF 60%. There are segmental wall motion abnormalities. Normal biatrial size. No significant valvular disease.nEstimated Peak Systolic PA Pressure 24 mmHg. Compared with study dated 10/05/17, there has been a slight improvement in global LV function. Otherwise, no significant change is noted.  06/08/21- CABG x6       ? Coronary artery disease due to lipid rich plaque 06/06/2021   ? Abnormal stress test 06/01/2021   ? Other chest pain 05/30/2021   ? Pacemaker 03/16/2017     08/17/2016 - s/p [employer]ude PPM implantation     ? Syncope 03/14/2017     07/24/16-Carotid US: Heterogeneous plaquing of the bilateral carotid bifurcations.  No evidence of hemodynamically significant stenosis of the bilateral carotid arterial systems.  Antegrade vertebral artery flow bilaterally.  No evidence of hemodynamically significant stenosis of the bilateral proximal subclavian arteries.   08/30/16-Regadenoson Thallium MPI:  EF 40%.  Study is abnormal primarily due to global LV hypokinesis with an estimated ejection fraction of 40%. No evidence of significant myocardial ischemia. ECG portion of the study is negative for ischemia.  ?     ? 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 Abnormal ultrasound cardiogram 08/23/2016   ? Hyperlipemia 08/17/2016   ? HTN (hypertension) 08/17/2016     01/03/17-Echo:  Ef 45%.  No significant valve disease is identified.  Normal estimated PA systolic pressure.   10/05/17-Echo:  EF 40 %, global hypokinesis.  Grade I (mild) left ventricular diastolic dysfunction.  No significant valvular abnormalities.  Estimated Peak Systolic PA Pressure: 19 mmHg.  Sinuses of Valsalva are mildly dilated.  Carvedilol dose increased.          ? 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 AV block, 2nd degree      9/17 zio patch monitor:  Patient had a min HR of 20 bpm, max HR of 200 bpm, and avg HR of 93 bpm.  Predominant underlying rhythm was Sinus Rhythm.  4 episode(s) of AV Block (2nd? Mobitz II and High Grade) occurred, lasting a total of 28 secs.   Episode of High Grade AV block was associated with 3.3 seconds of Ventricular Asystole.   3 Supraventricular Tachycardia runs occurred, the run with the fastest interval lasting 11 beats with a max rate of 200 bpm (avg 168 bpm); the run with the fastest interval was also the longest.  Isolated  SVEs were rare (<1.0%, 331), SVE Couplets were rare (<1.0%, 28), and SVE Triplets were rare (<1.0%, 1).  Isolated VEs were rare (<1.0%, 249), VE Couplets were rare (<1.0%, 1), and VE Triplets were rare (<1.0%, 1).  No symptoms were reported  No atrial fibrillation or flutter was recorded           Review of Systems   Constitutional: Negative.   HENT: Negative.    Eyes: Negative.    Cardiovascular: Negative.    Respiratory: Negative.    Endocrine: Negative.    Hematologic/Lymphatic: Negative.    Skin: Negative.    Gastrointestinal: Negative.    Genitourinary: Negative.    Neurological: Negative.    Psychiatric/Behavioral: Negative.    Allergic/Immunologic: Negative.        Physical Exam   General Appearance: no acute distress  Skin: warm & intact  HEENT: unremarkable  Neck Veins: neck veins are flat & not distended  Carotid Arteries: no bruits  Chest Inspection: chest is normal in appearance  Auscultation/Percussion: lungs clear to auscultation, no rales, rhonchi, or wheezing  Cardiac Rhythm: regular rhythm & normal rate  Cardiac Auscultation: Normal S1 & S2, no S3 or S4, no rub  Murmurs: no cardiac murmurs   Extremities: no lower extremity edema; 2+ symmetric distal pulses  Abdominal Exam: soft, non-tender, no masses, bowel sounds normal  Liver & Spleen: no organomegaly  Neurologic Exam: oriented to time, place and person; no focal neurologic deficits  Psychiatric: Normal mood and affect.  Behavior is normal. Judgment and thought content normal.     Cardiovascular Health Factors  Vitals BP Readings from Last 3 Encounters:   11/16/21 102/70   07/13/21 100/62   07/12/21 112/78     Wt Readings from Last 3 Encounters:   11/16/21 87.1 kg (192 lb)   07/13/21 84.1 kg (185 lb 4.8 oz)   07/12/21 83.9 kg (185 lb)     BMI Readings from Last 3 Encounters:   11/16/21 27.55 kg/m?   07/13/21 26.59 kg/m?   07/12/21 26.54 kg/m?      Smoking Social History     Tobacco Use   Smoking Status Never   Smokeless Tobacco Never      Lipid Profile Cholesterol   Date Value Ref Range Status   06/06/2021 119 <200 MG/DL Final     HDL   Date Value Ref Range Status 06/06/2021 36 (L) >40 MG/DL Final     LDL   Date Value Ref Range Status   06/06/2021 64 <100 mg/dL Final     Triglycerides   Date Value Ref Range Status   06/06/2021 172 (H) <150 MG/DL Final      Blood Sugar Hemoglobin A1C   Date  Value Ref Range Status   06/06/2021 5.4 4.0 - 6.0 % Final     Comment:     The ADA recommends that most patients with type 1 and type 2 diabetes maintain   an A1c level <7%.       Glucose   Date Value Ref Range Status   06/14/2021 99 70 - 100 MG/DL Final   96/02/5408 83 70 - 100 MG/DL Final   81/19/1478 90 70 - 100 MG/DL Final   29/56/2130 98 70 - 100 mg/dL Final     Glucose, POC   Date Value Ref Range Status   06/14/2021 103 (H) 70 - 100 MG/DL Final   86/57/8469 629 (H) 70 - 100 MG/DL Final   52/84/1324 401 (H) 70 - 100 MG/DL Final          Problems Addressed Today  Encounter Diagnoses   Name Primary?   ? AV block, 2nd degree Yes   ? Chronic diastolic heart failure, NYHA class 2 (HCC)    ? Coronary artery disease involving native coronary artery of native heart without angina pectoris    ? Primary hypertension    ? Mixed hyperlipidemia    ? S/P CABG x 6    ? S/P left atrial appendage ligation    ? Pacemaker    ? Atrial fibrillation, unspecified type (HCC)        Assessment and Plan     1.  CAD with 6 vessel CABG in August 2022 (LIMA to the LAD, rSVG to PDA, rSVGto PLA, rSVG to D1 and sequential rSVG to OM1/OM2).  I did not pick up on any concerning angina or anginal equivalent.  He remains on low-dose aspirin, and statin therapy.    2.  Brief postop atrial fibrillation status post left atrial appendage clip.  His remote device interrogation in October showed brief atrial fibrillation postoperatively otherwise no recurrence.  His RA and RV leads have showed decreased sensing and impedance.  We will obtain a remote device interrogation tonight due to the concerns of elevated heart rates over the past 2 months which could be concerning for PAF.  His thyroid panel in April was unremarkable.  -We will have her device interrogation when he sees Dr. Neale Burly in April.    3.  History of second-degree AV block with pacemaker implant 2017.  He continues with remote device interrogations every 3 months, and yearly office checks.    4.  Dyslipidemia.  He is currently on rosuvastatin 10 mg and tolerating without adverse effects.  His lipid panel in August showed a goal LDL of 64, elevated triglyceride 172.  He denies alcohol intake or diabetic mellitus.  He continues with omega fish oil 3 capsules daily.  We will obtain a repeat fasting lipid/liver profile in the near future.  We discussed switching omega fish oil to Vascepa 2 g twice a day if triglycerides are greater than 135.    5.  Primary hypertension.  His blood pressure remains well controlled at 102/70.  He continues with metoprolol tartrate twice daily.  We discussed switching tartrate to succinate 25 mg daily if his blood pressure remains soft.      6.  Hypothyroid on supplementation.  He is followed by his PCP for continued management.  As mentioned above his TSH/free T4 in April was within normal limits.     He is to follow-up with Dr. Neale Burly as previously scheduled on February 26, 2022 for limited echo for reevaluation of LV function,  and device interrogation to assess RA and LV leads.    Thank you for allowing me to participate in Wesley Stanley care.  Please feel free to contact me if I can be of further assistance.    Wesley Gaudy PA-c             Current Medications (including today's revisions)  ? acetaminophen (TYLENOL) 325 mg tablet Take two tablets by mouth every 6 hours as needed.   ? aspirin EC 81 mg tablet Take 81 mg by mouth daily. Take with food.   ? Cholecalciferol (VITAMIN D3) 125 mcg (5,000 unit) capsule Take 5,000 Units by mouth daily.   ? cyanocobalamin (vitamin B-12) (VITAMIN B-12 PO) Place 1,000 mcg under tongue daily.   ? MAGNESIUM PO Take 470 mg by mouth daily.   ? metoprolol tartrate 25 mg tablet Take one tablet by mouth twice daily.   ? NP THYROID 60 mg (1 gr) tablet Take 60 mg by mouth daily.   ? Omega-3-DHA-EPA-Fish Oil 1,000 mg (120 mg-180 mg) capsule Take 3 capsules by mouth daily.   ? PREGNENOLONE (BULK) MISC Take 100 mg by mouth daily.   ? rosuvastatin (CRESTOR) 10 mg tablet Take one tablet by mouth daily.

## 2021-11-19 ENCOUNTER — Encounter: Admit: 2021-11-19 | Discharge: 2021-11-19 | Payer: MEDICARE

## 2021-12-27 ENCOUNTER — Encounter: Admit: 2021-12-27 | Discharge: 2021-12-27 | Payer: MEDICARE

## 2021-12-30 ENCOUNTER — Encounter: Admit: 2021-12-30 | Discharge: 2021-12-30 | Payer: MEDICARE

## 2022-01-13 ENCOUNTER — Encounter: Admit: 2022-01-13 | Discharge: 2022-01-13 | Payer: MEDICARE

## 2022-01-13 ENCOUNTER — Ambulatory Visit: Admit: 2022-01-13 | Discharge: 2022-01-13 | Payer: MEDICARE

## 2022-01-13 DIAGNOSIS — Z951 Presence of aortocoronary bypass graft: Secondary | ICD-10-CM

## 2022-01-13 DIAGNOSIS — I10 Essential (primary) hypertension: Secondary | ICD-10-CM

## 2022-01-13 DIAGNOSIS — I4891 Unspecified atrial fibrillation: Secondary | ICD-10-CM

## 2022-01-13 DIAGNOSIS — I2510 Atherosclerotic heart disease of native coronary artery without angina pectoris: Secondary | ICD-10-CM

## 2022-01-13 MED ORDER — PERFLUTREN LIPID MICROSPHERES 1.1 MG/ML IV SUSP
1-10 mL | Freq: Once | INTRAVENOUS | 0 refills | Status: CP | PRN
Start: 2022-01-13 — End: ?
  Administered 2022-01-13: 2 mL via INTRAVENOUS

## 2022-01-13 NOTE — Progress Notes
STAT LAB REQUEST    Please send the following records for continuity of care:    Wesley Stanley, DOB 1950-12-22    Most recent lipid and liver profile, chemistry panel, and thyroid panel.       Please fax to:    Arlean Hopping  Fax: [phone]        Thank you,    Manfred Arch  Phone: [phone]    [facility]  [facility]

## 2022-01-19 ENCOUNTER — Encounter: Admit: 2022-01-19 | Discharge: 2022-01-19 | Payer: MEDICARE

## 2022-01-19 DIAGNOSIS — R55 Syncope and collapse: Secondary | ICD-10-CM

## 2022-01-19 NOTE — Telephone Encounter
01/19/2022 5:38 PM     Discussed echo results and JAF recommendations w/pt who verbalized understanding and had no further questions or concerns at this time. Problem list updated. Asked how pt was feeling since decreasing metoprolol from 25mg  daily to 12.5mg . pt states he is doing well and  Has not had anymore confusion episodes. Instructed pt to continue taking BP and writing them down to bring to OV with JAF on 4/11 and to cb if any of those episodes should occur again. Pt verbalized understanding and agreed to POC. MC

## 2022-01-19 NOTE — Telephone Encounter
-----   Message from Jana HalfJonathan A Freeman, MD sent at 01/17/2022  8:40 AM CDT -----  Please update Mr. Wesley Stanley with results of echo, normal study, if normal, no valvular heart disease. No additional testing planned at this time.

## 2022-01-23 ENCOUNTER — Encounter: Admit: 2022-01-23 | Discharge: 2022-01-23 | Payer: MEDICARE

## 2022-01-24 ENCOUNTER — Encounter: Admit: 2022-01-24 | Discharge: 2022-01-24 | Payer: MEDICARE

## 2022-01-24 ENCOUNTER — Ambulatory Visit: Admit: 2022-01-24 | Discharge: 2022-01-24 | Payer: MEDICARE

## 2022-01-24 DIAGNOSIS — E8770 Fluid overload, unspecified: Secondary | ICD-10-CM

## 2022-01-24 DIAGNOSIS — I2510 Atherosclerotic heart disease of native coronary artery without angina pectoris: Secondary | ICD-10-CM

## 2022-01-24 DIAGNOSIS — Z950 Presence of cardiac pacemaker: Secondary | ICD-10-CM

## 2022-01-24 DIAGNOSIS — R55 Syncope and collapse: Secondary | ICD-10-CM

## 2022-01-24 DIAGNOSIS — I441 Atrioventricular block, second degree: Secondary | ICD-10-CM

## 2022-01-24 DIAGNOSIS — I480 Paroxysmal atrial fibrillation: Secondary | ICD-10-CM

## 2022-02-14 ENCOUNTER — Encounter: Admit: 2022-02-14 | Discharge: 2022-02-14 | Payer: MEDICARE

## 2022-02-14 ENCOUNTER — Ambulatory Visit: Admit: 2022-02-14 | Discharge: 2022-02-15 | Payer: MEDICARE

## 2022-02-14 VITALS — BP 120/72 | HR 84 | Ht 70.0 in | Wt 189.2 lb

## 2022-02-14 DIAGNOSIS — I10 Essential (primary) hypertension: Secondary | ICD-10-CM

## 2022-02-14 DIAGNOSIS — E785 Hyperlipidemia, unspecified: Secondary | ICD-10-CM

## 2022-02-14 DIAGNOSIS — I4891 Unspecified atrial fibrillation: Principal | ICD-10-CM

## 2022-02-14 DIAGNOSIS — S0990XA Unspecified injury of head, initial encounter: Secondary | ICD-10-CM

## 2022-02-14 DIAGNOSIS — R55 Syncope and collapse: Secondary | ICD-10-CM

## 2022-02-14 DIAGNOSIS — I441 Atrioventricular block, second degree: Secondary | ICD-10-CM

## 2022-02-14 DIAGNOSIS — Z951 Presence of aortocoronary bypass graft: Secondary | ICD-10-CM

## 2022-02-14 MED ORDER — ROSUVASTATIN 20 MG PO TAB
20 mg | ORAL_TABLET | Freq: Every day | ORAL | 3 refills | 90.00000 days | Status: AC
Start: 2022-02-14 — End: ?

## 2022-02-14 NOTE — Patient Instructions
Your exam is normal, no concerns    Your EKG is unchanged no concerns    Recent echo and pacemaker check are normal, no concerns    Your cholesterol is slighltly higher than I would like    Please adjust rosuvastatin 10-->20mg  daily, new Rx sent to pharmacy    Please have cholesterol lab check in ~ 3 months, order provided today    Return for a follow up clinic visit in 6 months, or sooner for new questions or problems.    I work with a wonderful group of nurses that are part of the "purple" team.  There is a shared phone number for this group of nurses, [phone].  Please contact this number should you have any questions about your visit today or concerns about upcoming appointments/procedures.  We would like to know if you are having any worrisome cardiac symptoms.     Please call [phone] if needing to schedule an office visit.     We are also available by e-mail through the MyChart system if you have non-urgent concerns. We will get back to you as soon as possible. For urgent or after hours needs, please call [phone].

## 2022-02-15 DIAGNOSIS — E782 Mixed hyperlipidemia: Secondary | ICD-10-CM

## 2022-02-15 DIAGNOSIS — I10 Essential (primary) hypertension: Secondary | ICD-10-CM

## 2022-02-16 ENCOUNTER — Encounter: Admit: 2022-02-16 | Discharge: 2022-02-16 | Payer: MEDICARE

## 2022-02-16 DIAGNOSIS — Z950 Presence of cardiac pacemaker: Secondary | ICD-10-CM

## 2022-03-29 ENCOUNTER — Encounter: Admit: 2022-03-29 | Discharge: 2022-03-29 | Payer: MEDICARE

## 2022-05-18 ENCOUNTER — Encounter: Admit: 2022-05-18 | Discharge: 2022-05-18 | Payer: MEDICARE

## 2022-05-19 ENCOUNTER — Encounter: Admit: 2022-05-19 | Discharge: 2022-05-19 | Payer: MEDICARE

## 2022-05-19 NOTE — Telephone Encounter
Called pt regarding 16 beat run of VT on 6/30. Pt states that he has been "feeling just fine" and doesn't remember any episodes of lightheadedness, palpitations, or pre-syncope in the past. Will route to JAF for review.     Traffis, Zella Ball Cvm Nurse Gen Card Team Purple  Above pt only has a pacemaker and on last remote showed NSVT.     EGMs suggestive of 1:1 AT/ST with PVCs and some RA under sensing converting into short run of NSVT. NSVT shows clear onset and termination.   Total episodes: 1   Date of Episode: 05/05/2022   Duration: 16 beats (~ 3 seconds)   Avg. V-Rate: 185-236 bpm   NSVT not listed on problems list in Epic     Please see remote PPM note from 05/19/22 for further details.      Trey Paula

## 2022-08-17 ENCOUNTER — Encounter: Admit: 2022-08-17 | Discharge: 2022-08-17 | Payer: MEDICARE

## 2022-08-18 ENCOUNTER — Encounter: Admit: 2022-08-18 | Discharge: 2022-08-18 | Payer: MEDICARE

## 2022-08-21 ENCOUNTER — Encounter: Admit: 2022-08-21 | Discharge: 2022-08-21 | Payer: MEDICARE

## 2022-08-21 NOTE — Progress Notes
Wesley Stanley, 04/30/1951 has an appointment with Dr. Domingo Cocking on 08/25/22.    Please send recent lab results for continuity of care.    Thank you,   Tiphanie Vo    Phone: [phone]  Fax: [phone]

## 2022-08-25 ENCOUNTER — Encounter: Admit: 2022-08-25 | Discharge: 2022-08-25 | Payer: MEDICARE

## 2022-08-25 ENCOUNTER — Ambulatory Visit: Admit: 2022-08-25 | Discharge: 2022-08-26 | Payer: MEDICARE

## 2022-08-25 DIAGNOSIS — I4510 Unspecified right bundle-branch block: Secondary | ICD-10-CM

## 2022-08-25 DIAGNOSIS — E785 Hyperlipidemia, unspecified: Secondary | ICD-10-CM

## 2022-08-25 DIAGNOSIS — Z951 Presence of aortocoronary bypass graft: Secondary | ICD-10-CM

## 2022-08-25 DIAGNOSIS — I4891 Unspecified atrial fibrillation: Secondary | ICD-10-CM

## 2022-08-25 DIAGNOSIS — I10 Essential (primary) hypertension: Secondary | ICD-10-CM

## 2022-08-25 DIAGNOSIS — E782 Mixed hyperlipidemia: Secondary | ICD-10-CM

## 2022-08-25 DIAGNOSIS — S0990XA Unspecified injury of head, initial encounter: Secondary | ICD-10-CM

## 2022-08-25 DIAGNOSIS — R55 Syncope and collapse: Secondary | ICD-10-CM

## 2022-08-25 DIAGNOSIS — Z950 Presence of cardiac pacemaker: Secondary | ICD-10-CM

## 2022-08-25 NOTE — Patient Instructions
Your EKG is stable, no concerns    Cholesterol numbers look perfect    We have made no adjustment in your current medications    We are arranging an in office pacemaker check in office, next available    I'll plan for a follow up clinic visit in 1 year, or sooner if you're having questions or problems.     I work with a wonderful group of nurses that are part of the "purple" team.  There is a shared phone number for this group of nurses, [phone].  Please contact this number should you have any questions about your visit today or concerns about upcoming appointments/procedures.  We would like to know if you are having any worrisome cardiac symptoms.     Please call [phone] if needing to schedule an office visit.     We are also available by e-mail through the [place] system if you have non-urgent concerns. We will get back to you as soon as possible. For urgent or after hours needs, please call [phone].

## 2022-08-26 DIAGNOSIS — I10 Essential (primary) hypertension: Secondary | ICD-10-CM

## 2022-09-01 ENCOUNTER — Encounter: Admit: 2022-09-01 | Discharge: 2022-09-01 | Payer: MEDICARE

## 2022-10-11 ENCOUNTER — Encounter: Admit: 2022-10-11 | Discharge: 2022-10-11 | Payer: MEDICARE

## 2022-11-03 ENCOUNTER — Encounter: Admit: 2022-11-03 | Discharge: 2022-11-03 | Payer: MEDICARE

## 2022-11-03 ENCOUNTER — Ambulatory Visit: Admit: 2022-11-03 | Discharge: 2022-11-03 | Payer: MEDICARE

## 2022-11-03 DIAGNOSIS — I4891 Unspecified atrial fibrillation: Secondary | ICD-10-CM

## 2022-11-03 DIAGNOSIS — Z950 Presence of cardiac pacemaker: Secondary | ICD-10-CM

## 2022-11-16 ENCOUNTER — Encounter: Admit: 2022-11-16 | Discharge: 2022-11-16 | Payer: MEDICARE

## 2022-12-13 ENCOUNTER — Encounter: Admit: 2022-12-13 | Discharge: 2022-12-13 | Payer: MEDICARE

## 2023-01-31 ENCOUNTER — Encounter: Admit: 2023-01-31 | Discharge: 2023-01-31 | Payer: MEDICARE

## 2023-01-31 MED ORDER — ROSUVASTATIN 20 MG PO TAB
20 mg | ORAL_TABLET | Freq: Every day | ORAL | 2 refills | 90.00000 days | Status: AC
Start: 2023-01-31 — End: ?

## 2023-01-31 NOTE — Telephone Encounter
Last OV: 08/25/22 (next JAF OV 07/20/23)  Last labs: 05/17/22    Refilled per protocol

## 2023-02-15 ENCOUNTER — Encounter: Admit: 2023-02-15 | Discharge: 2023-02-15 | Payer: MEDICARE

## 2023-02-15 DIAGNOSIS — I441 Atrioventricular block, second degree: Secondary | ICD-10-CM

## 2023-02-15 DIAGNOSIS — Z950 Presence of cardiac pacemaker: Secondary | ICD-10-CM

## 2023-02-16 ENCOUNTER — Encounter: Admit: 2023-02-16 | Discharge: 2023-02-16 | Payer: MEDICARE

## 2023-02-16 NOTE — Telephone Encounter
**  STAT LAB REQUEST**    Patient: Wesley Stanley, DOB: 06-30-1951    For continuity of care, please fax lab results drawn in March 2024 to [phone] (attn: Dr Neale Burly).  Please call us or fax back to notify us if no recent labs drawn at your office.     Thank you!  Toni Amend, RN  [facility]  Ph: [phone]

## 2023-02-20 ENCOUNTER — Encounter: Admit: 2023-02-20 | Discharge: 2023-02-20 | Payer: MEDICARE

## 2023-05-17 ENCOUNTER — Encounter: Admit: 2023-05-17 | Discharge: 2023-05-17 | Payer: MEDICARE

## 2023-05-17 DIAGNOSIS — E782 Mixed hyperlipidemia: Secondary | ICD-10-CM

## 2023-05-18 ENCOUNTER — Encounter: Admit: 2023-05-18 | Discharge: 2023-05-18 | Payer: MEDICARE

## 2023-05-18 DIAGNOSIS — Z789 Other specified health status: Secondary | ICD-10-CM

## 2023-05-18 DIAGNOSIS — E782 Mixed hyperlipidemia: Secondary | ICD-10-CM

## 2023-05-18 DIAGNOSIS — I2510 Atherosclerotic heart disease of native coronary artery without angina pectoris: Secondary | ICD-10-CM

## 2023-05-18 MED ORDER — REPATHA SURECLICK 140 MG/ML SC PNIJ
140 mg | SUBCUTANEOUS | 11 refills | 28.00000 days | Status: AC
Start: 2023-05-18 — End: ?

## 2023-05-18 NOTE — Telephone Encounter
05/18/2023 3:08 PM   PC to pt with JAF's recommendations to start Repatha. Teaching done on Repatha. Pt verbalizes understanding and is agreeable to trying it. Repatha script sent to [facility] pharmacy per pt request. Pt will etl [place] know if his OOP is affordable.

## 2023-05-18 NOTE — Telephone Encounter
-----   Message from Jerolyn Shin, MD sent at 05/17/2023  5:34 PM CDT -----  So we really need to get his lipids in a better spot, please update allergy list with Zetia.    Lets try to get Repatha, please review lack of side effects with this medication and marked effects on lipid-lowering.    If he is agreeable lets get the started ASAP    Thanks

## 2023-05-18 NOTE — Telephone Encounter
05/18/2023 12:41 PM   Attempted to call pt to further discuss lab results reviewed by JAF. LVM with pt regarding wanting to discuss JAF recommendations. Advise pt to call back ASAP to further discuss. Provided call back number.

## 2023-05-20 ENCOUNTER — Encounter: Admit: 2023-05-20 | Discharge: 2023-05-20 | Payer: MEDICARE

## 2023-05-22 ENCOUNTER — Encounter: Admit: 2023-05-22 | Discharge: 2023-05-22 | Payer: MEDICARE

## 2023-05-25 ENCOUNTER — Encounter: Admit: 2023-05-25 | Discharge: 2023-05-25 | Payer: MEDICARE

## 2023-06-18 ENCOUNTER — Encounter: Admit: 2023-06-18 | Discharge: 2023-06-18 | Payer: MEDICARE

## 2023-07-20 ENCOUNTER — Encounter: Admit: 2023-07-20 | Discharge: 2023-07-20 | Payer: MEDICARE

## 2023-07-20 ENCOUNTER — Ambulatory Visit: Admit: 2023-07-20 | Discharge: 2023-07-21 | Payer: MEDICARE

## 2023-07-20 DIAGNOSIS — I4510 Unspecified right bundle-branch block: Secondary | ICD-10-CM

## 2023-07-20 DIAGNOSIS — I5032 Chronic diastolic (congestive) heart failure: Secondary | ICD-10-CM

## 2023-07-20 DIAGNOSIS — R55 Syncope and collapse: Secondary | ICD-10-CM

## 2023-07-20 DIAGNOSIS — S0990XA Unspecified injury of head, initial encounter: Secondary | ICD-10-CM

## 2023-07-20 DIAGNOSIS — Z951 Presence of aortocoronary bypass graft: Secondary | ICD-10-CM

## 2023-07-20 DIAGNOSIS — I2510 Atherosclerotic heart disease of native coronary artery without angina pectoris: Secondary | ICD-10-CM

## 2023-07-20 DIAGNOSIS — I10 Essential (primary) hypertension: Secondary | ICD-10-CM

## 2023-07-20 DIAGNOSIS — E785 Hyperlipidemia, unspecified: Secondary | ICD-10-CM

## 2023-07-20 DIAGNOSIS — I4891 Unspecified atrial fibrillation: Secondary | ICD-10-CM

## 2023-07-20 NOTE — Progress Notes
Date of Service: 07/20/2023    Wesley Stanley is a 72 y.o. male.       HPI       I had the opportunity to see Wesley Stanley back today for a follow-up visit.  Wesley Stanley is a very pleasant 73 year old individual now 2 years out from his 6 vessel bypass surgery.  Accompanying the surgery he had an atrial clip placed.  He was found have left main stenosis on a heart catheterization in August 2022, and 99% occlusion leading to his emergent surgery just 2 days later.  Wesley Stanley has had a very nice recovery.  He is back to walking he is doing 30 miles per week.  He walks with friends in [place] 3 days/week, he walks his land (3.6 acres[place] 3 days/week, it sounds like his [place] quite [place].    Wesley Stanley notes no concerning symptoms of chest pain or dyspnea.  He has had no decline in his exercise capacity.  He is following an intermittent fasting diet, he is down 11 pounds since my last visit with him.  He fast for 16 hours 3 days/week, this is been very helpful.  Wesley Stanley is taking over-the-counter supplements.  He has had intolerance of statins, Repatha, and Zetia.  I am impressed with his recent lipid profile from last week, total cholesterol 121 and LDL 55, this is with just diet control and over-the-counter fish oil.       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 Global left ventricular systolic function becomes hyperdynamic. Left ventricular ejection fraction increases and left ventricular volume decreases. ECG changes at peak exercise (horizontal ST segment depression) suggestive of ischemia, with normal LV systolic function; No regional wall motion abnormalities seen.  03/15/09- Bruce Treadmill Thallium MPI: LVEF 48%. This study suggests the low probability of any obstructive coronary disease. No regions of ischemic myocardium are appreciated at this level of exercise. No high risk markers are noted. Left ventricular function is borderline low with an EF of 48% and mild diffuse hypokinesis. No high risk markers are appreciated on this study. We have no prior studies for comparison.  08/30/16- Regadenoson Thallium MPI:  LVEF 40%.  There is no evidence of significant myocardial ischemia.  Lung uptake of radiotracer was within normal limits.  The ECG portion of the study is negative for ischemia.  01/03/17- Echo: LVEF 45%. No significant valve disease is identified. Comparison is made to prior study  of 07/24/16, there has been very slight drop in LV function, EF previously ~ 50%.   10/05/17- Limited Echo: LVEF 40%. Moderately depressed left ventricular systolic function. Grade I (mild) left ventricular diastolic dysfunction. No significant valvular abnormalities. The sinuses of Valsalva are mildly dilated.  05/17/21- Bruce Treadmill Thallium MPI [facility], [place], [place]): LVEF 45%. Large mostly fixed perfusion defect involving the Inferior and Inferolateral walls with evidence of peri-infarct ischemia. With EKG changes during the exercise and recovery periods, I recommend invasive diagnostic. High risk study, myocardial perfusion imaging is abnormal. Pt set up for Left Heart Cath  06/06/21- Cardiac Cath: 3-vessel calcified coronary artery disease with left main 99% stenosis with normal LVEDP without aortic stenosis. Ordered a cardiothoracic surgical consultation for possible bypass grafting.  06/06/21- Carotid Duplex: There is mild atheromatous plaque visualized in bilateral common and internal carotid arteries. No hemodynamically significant (>50%) stenosis measured in the common and internal carotid arteries bilaterally  06/07/21- Echo: LVEF 60%. There are segmental wall motion abnormalities. Normal biatrial size. No significant valvular disease.nEstimated Peak Systolic PA Pressure 24 mmHg. Compared with study dated 10/05/17, there has been a slight improvement in global LV function. Otherwise, no significant change is noted.  06/08/21- CABG x6  01/13/22- Echo: LVEF 55%. There are segmental wall motion abnormalities, as described below. Abnormal septal motion consistent with right ventricular pacing. No significant valvular disease. Estimated Peak Systolic PA Pressure 24 mmHg The aortic root is mildly dilated. No pericardial effusion. Compared with study dated 06/07/2021, no significant change is noted.        Coronary artery disease due to lipid rich plaque 06/06/2021    Abnormal stress test 06/01/2021    Other chest pain 05/30/2021    Pacemaker 03/16/2017     08/17/2016 - s/p St.Jude PPM implantation      Syncope 03/14/2017     07/24/16-Carotid US: Heterogeneous plaquing of the bilateral carotid bifurcations.  No evidence of hemodynamically significant stenosis of the bilateral carotid arterial systems.  Antegrade vertebral artery flow bilaterally.  No evidence of hemodynamically significant stenosis of the bilateral proximal subclavian arteries.   08/30/16-Regadenoson Thallium MPI:  EF 40%.  Study is abnormal primarily due to global LV hypokinesis with an estimated ejection fraction of 40%. No evidence of significant myocardial ischemia. ECG portion of the study is negative for ischemia.  01/13/22- Echo: LVEF 55%. There are segmental wall motion abnormalities, as described below. Abnormal septal motion consistent with right ventricular pacing. No significant valvular disease. Estimated Peak Systolic PA Pressure 24 mmHg The aortic root is mildly dilated. No pericardial effusion. Compared with study dated 06/07/2021, no significant change is noted.           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  No significant valve disease is identified.  Normal estimated PA systolic pressure.   10/05/17-Echo:  EF 40 %, global hypokinesis.  Grade I (mild) left ventricular diastolic dysfunction.  No significant valvular abnormalities.  Estimated Peak Systolic PA Pressure: 19 mmHg.  Sinuses of Valsalva are mildly dilated. Carvedilol dose increased.           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  Predominant underlying rhythm was Sinus Rhythm.  4 episode(s) of AV Block (2nd? Mobitz II and High Grade) occurred, lasting a total of 28 secs.   Episode of High Grade AV block was associated with 3.3 seconds of Ventricular Asystole.   3 Supraventricular Tachycardia runs occurred, the run with the fastest interval lasting 11 beats with a max rate of 200 bpm (avg 168 bpm); the run with the fastest interval was also the longest.  Isolated  SVEs were rare (<1.0%, 331), SVE Couplets were rare (<1.0%, 28), and SVE Triplets were rare (<1.0%, 1).  Isolated VEs were rare (<1.0%, 249), VE Couplets were rare (<1.0%, 1), and VE Triplets were rare (<1.0%, 1).  No symptoms were reported  No atrial fibrillation or flutter was recorded           Review of Systems   Constitutional: Negative.   HENT: Negative.     Eyes: Negative.    Cardiovascular: Negative.    Respiratory: Negative.     Endocrine: Negative.    Hematologic/Lymphatic: Negative.    Skin: Negative.    Gastrointestinal: Negative.    Genitourinary: Negative.    Neurological: Negative.    Psychiatric/Behavioral: Negative.     Allergic/Immunologic: Negative.        Physical Exam  General Appearance: no acute distress, full alert and oriented  Skin: warm, moist, no ulcers  HEENT: unremarkable, moist mucous membranes  Neck Veins: neck veins are flat, neck veins are not distended  Carotid Arteries: normal carotid upstroke bilaterally, no bruits  Chest Inspection: chest is normal in appearance  Auscultation/Percussion: lungs clear to auscultation, no rales, rhonchi, or wheezing  Cardiac Rhythm: regular rhythm and normal rate  Cardiac Auscultation: Normal S1 & S2, no S3 or S4, no rub  Murmurs: no cardiac murmurs   Extremities: no lower extremity edema; 2+ symmetric distal pulses  Abdominal Exam: soft, non-tender, no masses, bowel sounds normal  Liver & Spleen: no organomegaly  Neurologic Exam: neurological assessment grossly intact      Cardiovascular Studies    ECG-sinus rhythm, 79, 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   08/25/22 26.96 kg/m?   02/14/22 27.15 kg/m?      Smoking Social History     Tobacco Use   Smoking Status Never   Smokeless Tobacco Former    Types: Chew    Quit date: 11/06/2013      Lipid Profile Cholesterol   Date Value Ref Range Status   07/11/2023 121 <200 mg/dL Final     HDL   Date Value Ref Range Status   07/11/2023 50 > OR = 40 mg/dL Final     LDL   Date Value Ref Range Status   07/11/2023 55 mg/dL (calc) Final Comment:     Reference range: <100     Desirable range <100 mg/dL for primary prevention;    <70 mg/dL for patients with CHD or diabetic patients   with > or = 2 CHD risk factors.  LDL-C is now calculated using the Martin-Hopkins   calculation, which is a validated novel method providing   better accuracy than the Friedewald equation in the   estimation of LDL-C.   Horald Pollen et al. Lenox Ahr. 2130;865(78): 2061-2068   (http://education.[facility].com/faq/FAQ164)       Triglycerides   Date Value Ref Range Status   07/11/2023 82 <150 mg/dL Final      Blood Sugar Hemoglobin A1C   Date Value Ref Range Status   06/06/2021 5.4 4.0 - 6.0 % Final     Comment:     The ADA recommends that most patients with type 1 and type 2 diabetes maintain   an A1c level <7%.       Glucose   Date Value Ref Range Status   07/11/2023 101 (H) 65 - 99 mg/dL Final     Comment:                   Fasting reference interval     For someone without known diabetes, a glucose value  between 100 and 125 mg/dL is consistent with  prediabetes and should be confirmed with a  follow-up test.        12/27/2022 106  Final   06/14/2021 99 70 - 100 MG/DL Final   46/96/2952 83 70 - 100 MG/DL Final   84/13/2440 98 70 - 100 mg/dL Final     Glucose, POC   Date Value Ref Range Status   06/14/2021 103 (H) 70 - 100 MG/DL Final   09/02/2535 644 (H) 70 - 100 MG/DL Final   03/47/4259 563 (H) 70 - 100 MG/DL Final          Problems Addressed Today  Encounter Diagnoses   Name Primary?    Atrial fibrillation, unspecified type (HCC) Yes    Chronic diastolic heart failure, NYHA class 2 (HCC)     S/P CABG x 6     RBBB     Coronary artery disease involving native coronary artery of native heart without angina pectoris     Primary hypertension        Assessment and Plan     1.  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  His Medtronic device is functioning appropriately, remote interrogation in July showing low pacing percentage, 1% in the atrium, 1% in the ventricle.  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    2.  Pacemaker-Mr. Terrel will come in for his in office check in December.  He has a Medtronic device, he has 5 years left on the current generator, device implant was in 2017.    3.  Coronary artery disease status post bypass-Jontue had his 6 vessel bypass operation in 2022, just over 2 years ago.  He is doing quite well at this time, no concerning symptoms, excellent exercise habits as outlined above.  His risk factors are under good control.  No further testing is planned at this time, he is stable with respect to his CAD.    4.  Hyperlipidemia-Mr. Terrel has been intolerant of statins and PCSK9 inhibitors.  He is modified his diet and made excellent strides, historically we had seen total cholesterol 236 and LDL 143, he is currently at 121 and 55 respectively.  His triglycerides are at 82 and HDL 50.  I have encouraged him to continue his current diet and continue his good exercise habits.  I have asked Ishmel to follow-up with me in 1 year.         Current Medications (including today's revisions)   aspirin EC 81 mg tablet Take one tablet by mouth daily. Take with food.    Cholecalciferol (VITAMIN D3) 125 mcg (5,000 unit) capsule Take one capsule by mouth daily.    cyanocobalamin (vitamin B-12) (VITAMIN B-12 PO) Place 1,000 mcg under tongue daily.    MAGNESIUM PO Take 470 mg by mouth daily.    metoprolol tartrate 25 mg tablet Take one-half tablet by mouth twice daily.    Omega-3-DHA-EPA-Fish Oil 1,000 mg (120 mg-180 mg) capsule Take three capsules by mouth daily.    PREGNENOLONE (BULK) MISC Take 100 mg by mouth daily.    thyroid pork (ARMOUR THYROID) 60 mg (1 gr) tablet Take one tablet by mouth daily.            Total time spent on today's office visit was 30 minutes. This includes face-to-face in person visit with patient as well as nonface-to-face time including review of the EMR, outside records, labs, radiologic studies, echocardiogram & other cardiovascular studies, formulation of treatment plan, after visit summary, future disposition, and lastly on documentation.    This note was dictated using the Dragon speech recognition software.  Transcription errors may occur with the use of this software. Editing and proofreading were done by the author of this document.  In spite of the author's best effort to identify every error introduced by voice to text dictation, errors may still be present.

## 2023-07-20 NOTE — Patient Instructions
Your exam is normal    EKG unchanged, no concerns    Great exercise habits, keep up the good work    We have made no adjustment in your current medications    I am impressed with your cholesterol    I'll plan for a follow up clinic visit in 1 year, or sooner if you're having questions or problems.     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  There is a shared phone number for this group of nurses, [phone].  Please contact this number should you have any questions about your visit today or concerns about upcoming appointments/procedures.  We would like to know if you are having any worrisome cardiac symptoms.     Please call [phone] if needing to schedule an office visit.     We are also available by e-mail through the MyChart system if you have non-urgent concerns. We will get back to you as soon as possible. For urgent or after hours needs, please call [phone].

## 2023-08-15 ENCOUNTER — Encounter: Admit: 2023-08-15 | Discharge: 2023-08-15 | Payer: MEDICARE

## 2023-09-20 ENCOUNTER — Encounter: Admit: 2023-09-20 | Discharge: 2023-09-20 | Payer: MEDICARE

## 2023-10-02 ENCOUNTER — Encounter: Admit: 2023-10-02 | Discharge: 2023-10-02 | Payer: MEDICARE

## 2023-10-02 ENCOUNTER — Ambulatory Visit: Admit: 2023-10-02 | Discharge: 2023-10-02 | Payer: MEDICARE

## 2023-10-02 DIAGNOSIS — I441 Atrioventricular block, second degree: Secondary | ICD-10-CM

## 2023-10-02 NOTE — Telephone Encounter
10/02/2023 12:21 PM     Pt came into the office today for annual in office device check. Dr. Wallene Huh is in office seeing patients and noted that there were several abnormal heart rhythms present on device check of NSVT episodes. He verbally noted there were several episodes of NSVT and they were fast, up to 200bpm and one episode lasted over two minutes on 08/19/23. Dr. Wallene Huh verbally ordered pt to increase metoprolol from 12.5mg  BID to 25mg  BID, stress test and appt with Dr. Neale Burly after stress test results.     Discussed Dr. Johnanna Schneiders recommendations with pt. Pt notes that he is hesitant to make changes until after Dr. Neale Burly has reviewed. Pt notes that his metoprolol dose was decreased after he had several episodes of confusion and low BP and he is not wanting to increase this unless absolutely necessary. Pt also notes he feels great, he walks on average 30 miles a week, 3-4 miles daily and has no issues. Pt denies chest pain, SOB, decrease in exercise capacity, palpitations, n/v, recent procedures requiring anesthesia or any recent illnesses. 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 Pt would like for Dr. Neale Burly to review and confirm Dr. Wallene Huh recommendations prior to scheduling stress test, increasing metoprolol and schedule an appt with Dr. Neale Burly. Pt notes he is going out of town 12/19-12/28 to see his son in [place].     Will route to Dr. Neale Burly for review. MC

## 2023-11-30 ENCOUNTER — Ambulatory Visit: Admit: 2023-11-30 | Discharge: 2023-12-01 | Payer: MEDICARE

## 2023-12-09 ENCOUNTER — Encounter: Admit: 2023-12-09 | Discharge: 2023-12-10 | Payer: MEDICARE

## 2023-12-09 ENCOUNTER — Encounter: Admit: 2023-12-09 | Discharge: 2023-12-09 | Payer: MEDICARE

## 2023-12-11 ENCOUNTER — Encounter: Admit: 2023-12-11 | Discharge: 2023-12-11 | Payer: MEDICARE

## 2023-12-12 ENCOUNTER — Encounter: Admit: 2023-12-12 | Discharge: 2023-12-12 | Payer: MEDICARE

## 2023-12-12 DIAGNOSIS — I2510 Atherosclerotic heart disease of native coronary artery without angina pectoris: Secondary | ICD-10-CM

## 2023-12-12 DIAGNOSIS — I471 SVT (supraventricular tachycardia) (HCC): Secondary | ICD-10-CM

## 2023-12-12 DIAGNOSIS — I441 Atrioventricular block, second degree: Secondary | ICD-10-CM

## 2023-12-12 DIAGNOSIS — D62 Acute posthemorrhagic anemia: Secondary | ICD-10-CM

## 2023-12-12 DIAGNOSIS — I5032 Chronic diastolic (congestive) heart failure: Secondary | ICD-10-CM

## 2023-12-12 NOTE — Patient Instructions
[facility]  Nuclear Stress Test Instructions    Your cardiologist has asked that you have a nuclear stress test (also known as a Myocardial Perfusion Imaging (MPI) test.    This evaluation of your heart muscle consists of two sets of nuclear images and either a Treadmill stress test or a chemical stress test, decided by you and your physician.    You will get an IV placed in your arm for the test.    You will need to be able to raise your arm up by your head for about 20 minutes and lie on your back for about 10 minutes.  Please discuss this with your doctor or talk with the nuclear technologist or nurse if these are a problem for you.    It is recommended not to schedule any other appointment for the same day.      Wear comfortable clothing and walking shoes if you are walking on the treadmill.  Women should wear shorts or comfortable pants instead of dresses.   Sweatshirts or T-shirts work well for imaging.  There may be enough time to leave to get a snack after the stress portion of the test.   The Technologist will give you a list of appropriate types of food you may eat and tell you what time to return for second set of images.    PLEASE NO CAFFEINE 24 HOURS PRIOR TO TEST:  Examples include coffee, tea, decaffeinated drinks, colas, [facility], Dr. Reino Kent.  Some orange sodas and root beers have caffeine, please check.  No energy drinks, Excedrin, Midol, or any foods CONTAINING CHOCOLATE.   Consuming Caffeine may postpone your test.    PLEASE DO NOT EAT OR DRINK THE MORNING OF YOUR TEST.  Water is ok to drink with your morning medications.     HOLD THE FOLLOWING MEDICATIONS AS INDICATED BELOW:    Beta blockers -- metoprolol -- on the evening before and on the morning before the procedure.      DIABETIC PATIENTS:  if insulin dependent:  please take one third of your insulin with two pieces of dry toast and a small juice.  Bring remaining 2/3 insulin and oral diabetic medications with you to your test.    PLEASE NO TOBACCO PRODUCTS BETWEEN SCANS    You will NOT need a driver for this test.  But are welcome to bring a visitor with you.   Visitors will not be able to accompany you back to stress room.   Please do not bring children to the nuclear stress test.    TEST FINDINGS:   You will receive the results of the test within 7 business days of completion of this test by telephone. If you have any questions concerning your nuclear stress test or if you do not hear from your cardiologist/or nurse with 7 business days, please call our office.

## 2023-12-12 NOTE — Telephone Encounter
12/12/2023 4:22 PM     Called pt and discussed with both pt and pt spouse. Pt spouse notes that about 30 days prior to Sunday, 12/09/23, pt stopped taking metoprolol 12.5mg  BID d/t being told multiple times that he acts confused or in a brain fog when taking it. Pt notes he had been feeling fine prior to 12/09/23, no abnormal symptoms noted. 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 About five minutes after sitting down after the reading, he started to convulse. Pt states he woke up in the ED with no memory of event and feeling fine. Pt and pt spouse note that the cardiologist who consulted with pt during admission noted that his HR was elevated which could have been due to dehydration or from a virus but that he did need to be seen in follow up by his cardiologist. Pt and pt spouse note that prior to 12/09/23, his resting HR was in the mid 60s and now is in the 90s. Pt states today he restarted metoprolol 12.5mg  and plan to take it daily until he sees Dr. Neale Burly.     Discussed with pt recommendations from in office device check on 10/01/24 and noted that at that time pt mentioned he wanted to monitor for symptoms and did not want to get stress test or increase metoprolol. Discussed with pt doing stress test then seeing Dr. Neale Burly ASAP to follow up on elevated HR and finding alternative medication other than metoprolol or other treatments. Both pt and pt spouse verbalized understanding and agreed with POC. Discussed stress test instructions with pt and pt agreeable to send stress test instructions via Mychart. Pt confirmed appointment date/time/location. MC    Jana Half, MD  Liley Rake, RN    [facility],    It looks like Hollister was recently hospitalized at [place], discharge summary a bit confusing, unclear if he had seizure or orthostatic hypotension with syncope.    Would you please touch base with Leonette Most see how he is doing and whether we need to get him in ASAP?    Thanks

## 2023-12-14 ENCOUNTER — Encounter: Admit: 2023-12-14 | Discharge: 2023-12-14 | Payer: MEDICARE

## 2023-12-18 ENCOUNTER — Ambulatory Visit: Admit: 2023-12-18 | Discharge: 2023-12-19 | Payer: MEDICARE

## 2023-12-18 ENCOUNTER — Encounter: Admit: 2023-12-18 | Discharge: 2023-12-18 | Payer: MEDICARE

## 2023-12-18 DIAGNOSIS — I441 Atrioventricular block, second degree: Secondary | ICD-10-CM

## 2023-12-18 DIAGNOSIS — Z950 Presence of cardiac pacemaker: Secondary | ICD-10-CM

## 2023-12-18 DIAGNOSIS — I5032 Chronic diastolic (congestive) heart failure: Secondary | ICD-10-CM

## 2023-12-18 DIAGNOSIS — Z9889 Other specified postprocedural states: Secondary | ICD-10-CM

## 2023-12-18 DIAGNOSIS — Z951 Presence of aortocoronary bypass graft: Secondary | ICD-10-CM

## 2023-12-18 DIAGNOSIS — I4729 NSVT (nonsustained ventricular tachycardia) (HCC): Secondary | ICD-10-CM

## 2023-12-18 MED ORDER — DILTIAZEM HCL 120 MG PO CP24
120 mg | ORAL_CAPSULE | Freq: Every evening | ORAL | 1 refills | 90.00000 days | Status: AC
Start: 2023-12-18 — End: ?

## 2023-12-18 NOTE — Patient Instructions
Med changes today   Stop metoprolol with concerns for diarrhea  Start diltiazem 120mg  at bedtime    Keep up with walking as weather allows    We will arrange for an echocardiogram at [facility] office in next few weeks    I'll plan for a follow up clinic visit in 4 months, or sooner if you're having questions or problems.     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  There is a shared phone number for this group of nurses, [phone].  Please contact this number should you have any questions about your visit today or concerns about upcoming appointments/procedures.  We would like to know if you are having any worrisome cardiac symptoms.     Please call [phone] if needing to schedule an office visit.     We are also available by e-mail through the MyChart system if you have non-urgent concerns. We will get back to you as soon as possible.

## 2023-12-18 NOTE — Progress Notes
Date of Service: 12/18/2023    Wesley Stanley is a 73 y.o. male.       HPI     I had the opportunity see Wesley Stanley back today for an urgent work in visit following his recent hospitalization at [name] in [place].  He was in the hospital for 2 days initially presenting on February 2, discharged on February 4.  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  He had finished playing his 5 songs.  He subsequently stood for a long scripture reading.  As he sat down he found himself dizzy, he will out of the bone before slumping forward, his son and wife next to him quickly got him to a supine position.  A paramedic, nurse and physician were all in the congregation and assisting him.  It sounds like he started convulsing.  Thereafter he was quite agitated, combating everyone to sit up, he has no recall of any of this.  EMS was called out.  His initial blood pressure checked by the nurse in the congregation was 80 systolic.  Of note he has been contending with his habit of 16-hour fasting, he was doing this my last saw him in September and has continued over these recent months, he was in fact fasting that day.  He has also been battling diarrhea, loose bowel movements 3-4 times daily including the day of his presentation.    Mr. Wesley Stanley was not transported to [place] but stayed at [name].  His workup including a CT head, CTA head and neck was unremarkable.  There was no mention of rhythm disturbances.  He was seen by Dr. Meredeth Ide with cardiology and Dr. Magnus Ivan with neurology.  He was started on Keppra.  Notably his never had a seizure.  An EEG was planned, scheduled outpatient setting tomorrow.    Mr. 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  He was discovered to have critical left main disease leading to his urgent 6 vessel bypass surgery in August 2022.  We have struggled with medical management over these past couple of years, he is reporting diarrhea that he associates with metoprolol, currently on low-dose 12.5 mg twice daily.  We have tried multiple agents for management of his cholesterol, statins caused myalgias, Repatha was thought to contribute to his diarrhea, currently on no lipid-lowering therapy.    Mr. Wesley Stanley is on Keppra, he is struggling with fatigue associate with this, he will be addressing with Dr. Magnus Ivan in the upcoming days.         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 Global left ventricular systolic function becomes hyperdynamic. Left ventricular ejection fraction increases and left ventricular volume decreases. ECG changes at peak exercise (horizontal ST segment depression) suggestive of ischemia, with normal LV systolic function; No regional wall motion abnormalities seen.  03/15/09- Bruce Treadmill Thallium MPI: LVEF 48%. This study suggests the low probability of any obstructive coronary disease. No regions of ischemic myocardium are appreciated at this level of exercise. No high risk markers are noted. Left ventricular function is borderline low with an EF of 48% and mild diffuse hypokinesis. No high risk markers are appreciated on this study. We have no prior studies for comparison.  08/30/16- Regadenoson Thallium MPI:  LVEF 40%.  There is no evidence of significant myocardial ischemia.  Lung uptake of radiotracer was within normal limits.  The ECG portion of the study is negative for ischemia.  01/03/17- Echo: LVEF 45%. No significant valve disease is identified. Comparison is made to prior study of 07/24/16, there has been very slight drop in LV function, EF previously ~ 50%.   10/05/17- Limited Echo: LVEF 40%. Moderately depressed left ventricular systolic function. Grade I (mild) left ventricular diastolic dysfunction. No significant valvular abnormalities. The sinuses of Valsalva are mildly dilated.  05/17/21- Bruce Treadmill Thallium MPI [facility] [place], [place]): LVEF 45%. Large mostly fixed perfusion defect involving the Inferior and Inferolateral walls with evidence of peri-infarct ischemia. With EKG changes during the exercise and recovery periods, I recommend invasive diagnostic. High risk study, myocardial perfusion imaging is abnormal. Pt set up for Left Heart Cath  06/06/21- Cardiac Cath: 3-vessel calcified coronary artery disease with left main 99% stenosis with normal LVEDP without aortic stenosis. Ordered a cardiothoracic surgical consultation for possible bypass grafting.  06/06/21- Carotid Duplex: There is mild atheromatous plaque visualized in bilateral common and internal carotid arteries. No hemodynamically significant (>50%) stenosis measured in the common and internal carotid arteries bilaterally  06/07/21- Echo: LVEF 60%. There are segmental wall motion abnormalities. Normal biatrial size. No significant valvular disease.nEstimated Peak Systolic PA Pressure 24 mmHg. Compared with study dated 10/05/17, there has been a slight improvement in global LV function. Otherwise, no significant change is noted.  06/08/21- CABG x6  01/13/22- Echo: LVEF 55%. There are segmental wall motion abnormalities, as described below. Abnormal septal motion consistent with right ventricular pacing. No significant valvular disease. Estimated Peak Systolic PA Pressure 24 mmHg The aortic root is mildly dilated. No pericardial effusion. Compared with study dated 06/07/2021, no significant change is noted.        Coronary artery disease due to lipid rich plaque 06/06/2021    Abnormal stress test 06/01/2021    Other chest pain 05/30/2021    Pacemaker 03/16/2017     08/17/2016 - s/p St.Jude PPM implantation      Syncope 03/14/2017     07/24/16-Carotid US: Heterogeneous plaquing of the bilateral carotid bifurcations.  No evidence of hemodynamically significant stenosis of the bilateral carotid arterial systems.  Antegrade vertebral artery flow bilaterally.  No evidence of hemodynamically significant stenosis of the bilateral  proximal subclavian arteries.   08/30/16-Regadenoson Thallium MPI:  EF 40%.  Study is abnormal primarily due to global LV hypokinesis with an estimated ejection fraction of 40%. No evidence of significant myocardial ischemia. ECG portion of the study is negative for ischemia.  01/13/22- Echo: LVEF 55%. There are segmental wall motion abnormalities, as described below. Abnormal septal motion consistent with right ventricular pacing. No significant valvular disease. Estimated Peak Systolic PA Pressure 24 mmHg The aortic root is mildly dilated. No pericardial effusion. Compared with study dated 06/07/2021, no significant change is noted.           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  No significant valve disease is identified.  Normal estimated PA systolic pressure.   10/05/17-Echo:  EF 40 %, global hypokinesis.  Grade I (mild) left ventricular diastolic dysfunction.  No significant valvular abnormalities.  Estimated Peak Systolic PA Pressure: 19 mmHg.  Sinuses of Valsalva are mildly dilated. Carvedilol dose increased.           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  Predominant underlying rhythm was Sinus Rhythm.  4 episode(s) of AV Block (2nd? Mobitz II and High Grade) occurred, lasting a total of 28 secs.   Episode of High Grade AV block was associated with 3.3 seconds of Ventricular Asystole.   3 Supraventricular Tachycardia runs occurred, the run with the fastest interval lasting 11 beats with a max rate of 200 bpm (avg 168 bpm); the run with the fastest interval was also the longest.  Isolated  SVEs were rare (<1.0%, 331), SVE Couplets were rare (<1.0%, 28), and SVE Triplets were rare (<1.0%, 1).  Isolated VEs were rare (<1.0%, 249), VE Couplets were rare (<1.0%, 1), and VE Triplets were rare (<1.0%, 1).  No symptoms were reported  No atrial fibrillation or flutter was recorded           Review of Systems   Constitutional: Negative.   HENT: Negative.     Eyes: Negative.    Cardiovascular: Negative.    Respiratory: Negative.     Endocrine: Negative.    Skin: Negative.    Musculoskeletal: Negative.    Gastrointestinal: Negative.    Genitourinary: Negative.    Neurological: Negative.    Psychiatric/Behavioral: Negative.     Allergic/Immunologic: Negative.        Physical Exam  General Appearance: no acute distress, full alert and oriented  Skin: warm, moist, no ulcers  HEENT: unremarkable, moist mucous membranes  Neck Veins: neck veins are flat, neck veins are not distended  Carotid Arteries: normal carotid upstroke bilaterally, no bruits  Chest Inspection: chest is normal in appearance  Auscultation/Percussion: lungs clear to auscultation, no rales, rhonchi, or wheezing  Cardiac Rhythm: regular rhythm and normal rate  Cardiac Auscultation: Normal S1 & S2, no S3 or S4, no rub  Murmurs: no cardiac murmurs   Extremities: no lower extremity edema; 2+ symmetric distal pulses  Abdominal Exam: soft, non-tender, no masses, bowel sounds normal  Liver & Spleen: no organomegaly  Neurologic Exam: neurological assessment grossly intact    Cardiovascular Studies  No ECG today        Cardiovascular Health Factors  Vitals BP Readings from Last 3 Encounters:   12/18/23 116/74   07/20/23 116/78   08/25/22 110/78     Wt Readings from Last 3 Encounters:   12/18/23 79.8 kg (176 lb)   07/20/23 79.9 kg (176 lb 3.2 oz)   08/25/22 85.2 kg (187 lb 14.4 oz)     BMI Readings from Last 3 Encounters:   12/18/23 25.25 kg/m?   07/20/23 25.28 kg/m?   08/25/22 26.96 kg/m?      Smoking Social History     Tobacco Use   Smoking Status Never   Smokeless Tobacco Former    Types: Chew    Quit date: 11/06/2013      Lipid Profile Cholesterol   Date Value Ref Range Status   07/11/2023 121 <200 mg/dL Final     HDL   Date Value Ref Range Status   07/11/2023 50 > OR = 40 mg/dL Final     LDL   Date Value Ref Range Status   07/11/2023 55 mg/dL (calc) Final     Comment:     Reference range: <100     Desirable range <100 mg/dL for primary prevention;    <70 mg/dL for patients with CHD or diabetic patients   with > or = 2 CHD risk factors.     LDL-C is now calculated using the Martin-Hopkins   calculation, which is a validated novel method providing   better accuracy than the Friedewald equation in the   estimation of LDL-C.   Horald Pollen et al. Lenox Ahr. 1610;960(45): 2061-2068   (http://education.[facility].com/faq/FAQ164)       Triglycerides   Date Value Ref Range Status   07/11/2023 82 <150 mg/dL Final      Blood Sugar Hemoglobin A1C   Date Value Ref Range Status   06/06/2021 5.4 4.0 - 6.0 % Final     Comment:     The ADA recommends that most patients with type 1 and type 2 diabetes maintain   an A1c level <7%.       Glucose   Date Value Ref Range Status   12/11/2023 105  Final   07/11/2023 101 (H) 65 - 99 mg/dL Final     Comment:                   Fasting reference interval     For someone without known diabetes, a glucose value  between 100 and 125 mg/dL is consistent with  prediabetes and should be confirmed with a  follow-up test.        12/27/2022 106  Final     Glucose, POC   Date Value Ref Range Status   06/14/2021 103 (H) 70 - 100 MG/DL Final   40/98/1191 478 (H) 70 - 100 MG/DL Final   29/56/2130 865 (H) 70 - 100 MG/DL Final          Problems Addressed Today  Encounter Diagnoses   Name Primary?    NSVT (nonsustained ventricular tachycardia) (HCC) Yes    Pacemaker     Chronic diastolic heart failure, NYHA class 2 (HCC)     S/P CABG x 6     Second-degree heart block     S/P left atrial appendage ligation        Assessment and Plan     1.  Syncope, possible seizure-as detailed above he had an event on February 2 leading to hospitalization  at [name].  Based on the description of his symptoms that day I suspect underlying volume depletion with decreased venous return in setting of prolonged playing of the trumpet (5 songs) coupled with his fasting leading to volume depletion led to this hypotensive mediated event.  I am not sure that he had a seizure, he will be addressing with Dr. Magnus Ivan.  We checked his device today, there were no rhythm disturbances to which we could attribute this event on February 2.  I have encouraged him to come off his fasting diet which she has already done.  He will continue to work on adequate hydration.  He will address with Dr. Magnus Ivan the question of Keppra.  We will continue to track his device remotely as outlined below.    2.  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  His device check today is showing V pacing percentage of just 3.9%, a pacing percentage of just 1%.  He has had intermittent sinus tachycardia but also what appears to be SVT.  There have been no ventricular rhythm disturbances for the past 10 months.  There were no rhythm disturbances to which we could attribute this recent event.    3.  Coronary artery disease-Mr. Terrel is 2-1/2 years out from his 6 vessel bypass surgery.  He is reporting no concerning symptoms splitting wood daily, walking 5 miles daily with no limitations whatsoever.  We had contemplated a stress test after NSVT noted in April, we have not documented subsequent rhythm disturbances.  Will continue to monitor for now.  I have asked him to have an echocardiogram to assure LV function has not changed, historically normal.    4.  Hyperlipidemia-Mr. Terrel is intolerant of statins, recently stopped Repatha with concerns for associated diarrhea, we will observe off medications for now be on fish oil.    5.  SVT-we are seeing occasional SVT on his monitor.  I have transitioned his metoprolol (felt to be causing diarrhea) to diltiazem low-dose 120 mg daily.    Sims will follow-up with me in 4 months.         Current Medications (including today's revisions)   aspirin EC 81 mg tablet Take one tablet by mouth daily. Take with food.    Cholecalciferol (VITAMIN D3) 125 mcg (5,000 unit) capsule Take one capsule by mouth daily.    cyanocobalamin (vitamin B-12) (VITAMIN B-12 PO) Place 1,000 mcg under tongue daily.    dilTIAZem CD (CARDIZEM CD) 120 mg capsule Take one capsule by mouth at bedtime daily.    levETIRAcetam (KEPPRA) 500 mg tablet Take one tablet by mouth twice daily.    MAGNESIUM PO Take 470 mg by mouth daily.    Omega-3-DHA-EPA-Fish Oil 1,000 mg (120 mg-180 mg) capsule Take three capsules by mouth daily.    Prasterone (DHEA) 25 mg tab Take one tablet by mouth daily.    PREGNENOLONE (BULK) MISC Take 100 mg by mouth daily.    thyroid pork (ARMOUR THYROID) 60 mg (1 gr) tablet Take one tablet by mouth daily.            Total time spent on today's office visit was 45 minutes.  This includes face-to-face in person visit with patient as well as nonface-to-face time including review of the EMR, outside records, labs, radiologic studies, echocardiogram & other cardiovascular studies, formulation of treatment plan, after visit summary, future disposition,  and lastly on documentation.  This note was dictated using the Dragon speech recognition software.  Transcription errors may occur with the use of this software. Editing and proofreading were done by the author of this document.  In spite of the author's best effort to identify every error introduced by voice to text dictation, errors may still be present.

## 2024-01-01 ENCOUNTER — Ambulatory Visit: Admit: 2024-01-01 | Discharge: 2024-01-02 | Payer: MEDICARE

## 2024-01-01 ENCOUNTER — Encounter: Admit: 2024-01-01 | Discharge: 2024-01-01 | Payer: MEDICARE

## 2024-01-02 ENCOUNTER — Encounter: Admit: 2024-01-02 | Discharge: 2024-01-02 | Payer: MEDICARE

## 2024-01-02 NOTE — Telephone Encounter
 Left pt a detailed message with results below as we have permission to do so. I told him to call back with any questions.       Message  Received: Arnetha Gula, Sherie Don, MD  [facility] Nurse Gen Card Team [facility]  Team,    Echocardiogram obtained after recent syncopal event.    Please update Mr. [name] with results, overall no concerning findings, LV function normal, no significant valvular heart disease    No change from previous study 3/23 no additional testing at this time.    Thanks

## 2024-02-08 ENCOUNTER — Encounter: Admit: 2024-02-08 | Discharge: 2024-02-08

## 2024-02-08 DIAGNOSIS — Z950 Presence of cardiac pacemaker: Secondary | ICD-10-CM

## 2024-02-12 ENCOUNTER — Encounter: Admit: 2024-02-12 | Discharge: 2024-02-12

## 2024-04-02 ENCOUNTER — Encounter: Admit: 2024-04-02 | Discharge: 2024-04-02 | Payer: MEDICARE

## 2024-04-02 DIAGNOSIS — Z950 Presence of cardiac pacemaker: Secondary | ICD-10-CM

## 2024-04-02 DIAGNOSIS — I441 Atrioventricular block, second degree: Secondary | ICD-10-CM

## 2024-04-02 DIAGNOSIS — I2510 Atherosclerotic heart disease of native coronary artery without angina pectoris: Secondary | ICD-10-CM

## 2024-04-02 DIAGNOSIS — Z131 Encounter for screening for diabetes mellitus: Secondary | ICD-10-CM

## 2024-04-02 DIAGNOSIS — R55 Syncope and collapse: Secondary | ICD-10-CM

## 2024-04-02 DIAGNOSIS — E038 Other specified hypothyroidism: Secondary | ICD-10-CM

## 2024-04-09 ENCOUNTER — Encounter: Admit: 2024-04-09 | Discharge: 2024-04-09 | Payer: MEDICARE

## 2024-04-10 ENCOUNTER — Encounter: Admit: 2024-04-10 | Discharge: 2024-04-10 | Payer: MEDICARE

## 2024-04-10 NOTE — Progress Notes
 Upcoming OV with Dr Margie Sheller on 6/20. Will have Dr Margie Sheller review labs during visit.

## 2024-04-24 ENCOUNTER — Encounter: Admit: 2024-04-24 | Discharge: 2024-04-24 | Payer: MEDICARE

## 2024-04-25 ENCOUNTER — Encounter: Admit: 2024-04-25 | Discharge: 2024-04-25 | Payer: MEDICARE

## 2024-04-25 ENCOUNTER — Ambulatory Visit: Admit: 2024-04-25 | Discharge: 2024-04-26 | Payer: MEDICARE

## 2024-04-25 DIAGNOSIS — I10 Essential (primary) hypertension: Secondary | ICD-10-CM

## 2024-04-25 DIAGNOSIS — Z951 Presence of aortocoronary bypass graft: Secondary | ICD-10-CM

## 2024-04-25 DIAGNOSIS — I4891 Unspecified atrial fibrillation: Secondary | ICD-10-CM

## 2024-04-25 DIAGNOSIS — Z950 Presence of cardiac pacemaker: Secondary | ICD-10-CM

## 2024-04-25 DIAGNOSIS — R55 Syncope and collapse: Secondary | ICD-10-CM

## 2024-04-25 MED ORDER — RED YEAST RICE 600 MG PO TAB
600 mg | Freq: Two times a day (BID) | ORAL | 0 refills | Status: AC
Start: 2024-04-25 — End: ?

## 2024-04-25 NOTE — Progress Notes
 Date of Service: 04/25/2024    Wesley Stanley is a 73 y.o. male.       HPI     I had the opportunity to see Wesley Stanley back today for a follow-up visit, last seen him about 3 months ago.  At that time he had just been hospitalized at [facility]. Wesley Stanley after a syncopal event of unclear etiology.  His device check was unrevealing.  His workup in the hospital was unrevealing.  He went on to see Dr. Avie, neurologist here in the community.  An EEG was done, there was no seizure activity.  He had been on Keppra at discharge, slowly weaned over the course of the month, has been off the Keppra for about 2 months now.    Mr. Wesley Stanley reports no further dizziness, syncope or presyncope.  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  The description of his symptoms when I saw him in February really was more concerning for seizure, regardless and please see has not had any further episodes.    Mr. Wesley Stanley cardiac history is notable for coronary disease with 5 vessel bypass surgery in August 2022 in the setting of severe left main disease and unstable angina.  He has normal LV function, most recent echocardiogram February this year showed an EF of 55% and no valvular heart disease.    We have struggled in managing his lipids, he has been intolerant of statins, Zetia, and is concerned that Repatha  is causing lipids to get too low.  He was also worried about side effect with Repatha , diarrhea.  Notably off metoprolol , off Repatha  the diarrhea has resolved entirely.  We started diltiazem  at her last visit, he is tolerating this well.         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 Global left ventricular systolic function becomes hyperdynamic. Left ventricular ejection fraction increases and left ventricular volume decreases. ECG changes at peak exercise (horizontal ST segment depression) suggestive of ischemia, with normal LV systolic function; No regional wall motion abnormalities seen.  03/15/09- Bruce Treadmill Thallium MPI: LVEF 48%. This study suggests the low probability of any obstructive coronary disease. No regions of ischemic myocardium are appreciated at this level of exercise. No high risk markers are noted. Left ventricular function is borderline low with an EF of 48% and  mild diffuse hypokinesis. No high risk markers are appreciated on this study. We have no prior studies for comparison.  08/30/16- Regadenoson Thallium MPI:  LVEF 40%.  There is no evidence of significant myocardial ischemia.  Lung uptake of radiotracer was within normal limits.  The ECG portion of the study is negative for ischemia.  01/03/17- Echo: LVEF 45%. No significant valve disease is identified. Comparison is made to prior study of 07/24/16, there has been very slight drop in LV function, EF previously ~ 50%.   10/05/17- Limited Echo: LVEF 40%. Moderately depressed left ventricular systolic function. Grade I (mild) left ventricular diastolic dysfunction. No significant valvular abnormalities. The sinuses of Valsalva are mildly dilated.  05/17/21- Bruce Treadmill Thallium MPI [facility], [place], [place]): LVEF 45%. Large mostly fixed perfusion defect involving the Inferior and Inferolateral walls with evidence of peri-infarct ischemia. With EKG changes during the exercise and recovery periods, I recommend invasive diagnostic. High risk study, myocardial perfusion imaging is abnormal. Pt set up for Left Heart Cath  06/06/21- Cardiac Cath: 3-vessel calcified coronary artery disease with left main 99% stenosis with normal LVEDP without aortic stenosis. Ordered a cardiothoracic surgical consultation for possible bypass grafting.  06/06/21- Carotid Duplex: There is mild atheromatous plaque visualized in bilateral common and internal carotid arteries. No hemodynamically significant (>50%) stenosis measured in the common and internal carotid arteries bilaterally  06/07/21- Echo: LVEF 60%. There are segmental wall motion abnormalities. Normal biatrial size. No significant valvular disease.nEstimated Peak Systolic PA Pressure 24 mmHg. Compared with study dated 10/05/17, there has been a slight improvement in global LV function. Otherwise, no significant change is noted.  06/08/21- CABG x6  01/13/22- Echo: LVEF 55%. There are segmental wall motion abnormalities, as described below. Abnormal septal motion consistent with right ventricular pacing. No significant valvular disease. Estimated Peak Systolic PA Pressure 24 mmHg The aortic root is mildly dilated. No pericardial effusion. Compared with study dated 06/07/2021, no significant change is noted.  01/01/24- Echo: LVEF 55%. The right ventricle is mildly dilated. The right ventricular systolic function is at the lower limit of normal. M-Mode TAPSE 1.04 cm (normal >1.7 cm). Pacemaker lead present in the ventricle. Trace MR, mild TR, no AI. Mild dilatation of the sinuses of Valsalva and proximal ascending aorta. Compared to the previous study dated 01/13/2022: LVEF = 55%, mild MR, mild TR, trace AI, estimated PAP = 24 mmHg.        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  No evidence of hemodynamically significant stenosis of the bilateral carotid arterial systems.  Antegrade vertebral artery flow bilaterally.  No evidence of hemodynamically significant stenosis of the bilateral proximal subclavian arteries.   08/30/16-Regadenoson Thallium MPI:  EF 40%.  Study is abnormal primarily due to global LV hypokinesis with an estimated ejection fraction of 40%. No evidence of significant myocardial ischemia. ECG portion of the study is negative for ischemia.  01/13/22- Echo: LVEF 55%. There are segmental wall motion abnormalities, as described below. Abnormal septal motion consistent with right ventricular pacing. No significant valvular disease. Estimated Peak Systolic PA Pressure 24 mmHg The aortic root is mildly dilated. No pericardial effusion. Compared with study dated 06/07/2021, no significant change is noted.  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  No significant valve disease is identified.  Normal estimated PA systolic pressure.   10/05/17-Echo:  EF 40 %, global hypokinesis.  Grade I (mild) left ventricular diastolic dysfunction.  No significant valvular abnormalities.  Estimated Peak Systolic PA Pressure: 19 mmHg.  Sinuses of Valsalva are mildly dilated. Carvedilol  dose increased.           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  Predominant underlying rhythm was Sinus Rhythm.  4 episode(s) of AV Block (2nd? Mobitz II and High Grade) occurred, lasting a total of 28 secs.   Episode of High Grade AV block was associated with 3.3 seconds of Ventricular Asystole.   3 Supraventricular Tachycardia runs occurred, the run with the fastest interval lasting 11 beats with a max rate of 200 bpm (avg 168 bpm); the run with the fastest interval was also the longest.  Isolated  SVEs were rare (<1.0%, 331), SVE Couplets were rare (<1.0%, 28), and SVE Triplets were rare (<1.0%, 1).  Isolated VEs were rare (<1.0%, 249), VE Couplets were rare (<1.0%, 1), and VE Triplets were rare (<1.0%, 1).  No symptoms were reported  No atrial fibrillation or flutter was recorded           Review of Systems   Constitutional: Negative.   HENT: Negative.     Eyes: Negative.    Cardiovascular: Negative.    Respiratory: Negative.     Endocrine: Negative.    Hematologic/Lymphatic: Negative.    Skin: Negative.    Gastrointestinal: Negative.    Genitourinary: Negative.    Neurological: Negative.    Psychiatric/Behavioral: Negative.     Allergic/Immunologic: Negative.        Physical Exam  General Appearance: no acute distress, full alert and oriented  Skin: warm, moist, no ulcers  HEENT: unremarkable, moist mucous membranes  Neck Veins: neck veins are flat, neck veins are not distended  Carotid Arteries: normal carotid upstroke bilaterally, no bruits  Chest Inspection: chest is normal in appearance  Auscultation/Percussion: lungs clear to auscultation, no rales, rhonchi, or wheezing  Cardiac Rhythm: regular rhythm and normal rate  Cardiac Auscultation: Normal S1 & S2, no S3 or S4, no rub  Murmurs: no cardiac murmurs   Extremities: no lower extremity edema; 2+ symmetric distal pulses  Abdominal Exam: soft, non-tender, no masses, bowel sounds normal  Liver & Spleen: no organomegaly  Neurologic Exam: neurological assessment grossly intact    Cardiovascular Studies  No ECG today    Cardiovascular Health Factors  Vitals BP Readings from Last 3 Encounters:   04/25/24 132/88   01/01/24 120/77   12/18/23 116/74     Wt Readings from Last 3 Encounters:   04/25/24 80.6 kg (177 lb 11.2 oz)   01/01/24 78.9 kg (174 lb)   12/18/23 79.8 kg (176 lb)     BMI Readings from Last 3 Encounters:   04/25/24 25.50 kg/m?   01/01/24 24.97 kg/m?   12/18/23 25.25 kg/m?      Smoking Social History     Tobacco Use   Smoking Status Never  Smokeless Tobacco Former    Types: Chew    Quit date: 11/06/2013      Lipid Profile Cholesterol   Date Value Ref Range Status   04/09/2024 228 (H) <200 mg/dL Final     HDL   Date Value Ref Range Status   04/09/2024 51 > OR = 40 mg/dL Final     LDL   Date Value Ref Range Status   04/09/2024 150 (H) mg/dL (calc) Final     Comment:     Reference range: <100     Desirable range <100 mg/dL for primary prevention;    <70 mg/dL for patients with CHD or diabetic patients   with > or = 2 CHD risk factors.     LDL-C is now calculated using the Martin-Hopkins   calculation, which is a validated novel method providing   better accuracy than the Friedewald equation in the   estimation of LDL-C.   Gladis APPLETHWAITE et al. SANDREA. 7986;689(80): 2061-2068   (http://education.[facility].com/faq/FAQ164)       Triglycerides   Date Value Ref Range Status   04/09/2024 141 <150 mg/dL Final      Blood Sugar Hemoglobin A1C   Date Value Ref Range Status   04/09/2024 5.7 (H) <5.7 % Final     Comment:     For someone without known diabetes, a hemoglobin   A1c value between 5.7% and 6.4% is consistent with  prediabetes and should be confirmed with a   follow-up test.     For someone with known diabetes, a value <7%  indicates that their diabetes is well controlled. A1c  targets should be individualized based on duration of  diabetes, age, comorbid conditions, and other  considerations.     This assay result is consistent with an increased risk  of diabetes.     Currently, no consensus exists regarding use of  hemoglobin A1c for diagnosis of diabetes for children.     Test Performed at:  [facility]  [address]  [place]  [address]  THUY-LIEU VO,MD       Glucose   Date Value Ref Range Status   04/09/2024 99 65 - 99 mg/dL Final     Comment:                   Fasting reference interval        12/11/2023 105  Final   07/11/2023 101 (H) 65 - 99 mg/dL Final     Comment:                   Fasting reference interval     For someone without known diabetes, a glucose value  between 100 and 125 mg/dL is consistent with  prediabetes and should be confirmed with a  follow-up test.          Glucose, POC   Date Value Ref Range Status   06/14/2021 103 (H) 70 - 100 MG/DL Final   91/90/7977 895 (H) 70 - 100 MG/DL Final   91/93/7977 886 (H) 70 - 100 MG/DL Final          Problems Addressed Today  Encounter Diagnoses   Name Primary?    S/P CABG x 6 Yes    Primary hypertension     Mixed hyperlipidemia     Syncope, unspecified syncope type     Atrial fibrillation, unspecified type (CMS-HCC)     Pacemaker        Assessment and Plan  1.  Coronary artery disease-Mr. Terrel is stable at this time.  His LV function is normal.  We are 3 years out from his bypass operation.  He is reporting no angina.  He will continue with aspirin .  We will need to continue to work on his lipids as outlined below.  He does not require further testing at this time.    2.  Primary hypertension-blood pressure today is reasonly well-controlled on diltiazem , he will continue with 120 mg daily.  He had previously been on metoprolol  for management of his remote atrial fibrillation.  He seems to be doing fine on the diltiazem  without side effects, having diarrhea on metoprolol .    3.  Hyperlipidemia-unfortunately lipids checked earlier this month are far above goal, LDL 150, total cholesterol 228.  His HDL is favorable, 51 and triglycerides normal at 141.  He is currently on no lipid-lowering therapy and would prefer to stay off medications.  He is concerned about side effects with low cholesterol breaking up concerns for malignancy.  He is had side effects with statins, ezetimibe, and reportedly diarrhea with Repatha .  I have asked him to start on red yeast rice, he will do 1 tab twice daily 600 mg.  He will continue to modify his diet.  He will have a repeat FLP in 6 months, an order was provided for this today.    4.  Syncope-Clifton had an isolated syncopal episode at church in February, my previous note outlined this at length.  It did not sound cardiac in nature.  His pacemaker check was unrevealing, no arrhythmias at the time of the event.  He is off Keppra, EEG was negative for seizure activity.  We will continue to monitor, extensive testing in the hospital at [facility]. Wesley Stanley was unremarkable.    5.  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  He is using the device minimally, a pacing 1%, V pacing 1%.  He has 3.7 years left on the generator, we will continue with routine remote checks and yearly in office checks.    Kellyn will follow-up with me in 1 year         Current Medications (including today's revisions)   aspirin  EC 81 mg tablet Take one tablet by mouth daily. Take with food.    Cholecalciferol (VITAMIN D3) 125 mcg (5,000 unit) capsule Take one capsule by mouth daily.    cyanocobalamin (vitamin B-12) (VITAMIN B-12 PO) Place 1,000 mcg under tongue daily.    dilTIAZem  CD (CARDIZEM  CD) 120 mg capsule Take one capsule by mouth at bedtime daily.    MAGNESIUM  PO Take 470 mg by mouth daily.    Omega-3-DHA-EPA-Fish Oil 1,000 mg (120 mg-180 mg) capsule Take three capsules by mouth daily.    Prasterone (DHEA) 25 mg tab Take one tablet by mouth daily.    PREGNENOLONE (BULK) MISC Take 100 mg by mouth daily.    red yeast rice 600 mg tab Take one tablet by mouth twice daily.    thyroid  pork (ARMOUR THYROID ) 60 mg (1 gr) tablet Take one tablet by mouth daily.          Total time spent on today's office visit was 30 minutes. This includes face-to-face in person visit with patient as well as nonface-to-face time including review of the EMR, outside records, labs, radiologic studies, echocardiogram & other cardiovascular studies, formulation of treatment plan, after visit summary, future disposition, and lastly on documentation.    This note was dictated using the [employer]  speech recognition software.  Transcription errors may occur with the use of this software. Editing and proofreading were done by the author of this document.  In spite of the author's best effort to identify every error introduced by voice to text dictation, errors may still be present.

## 2024-04-25 NOTE — Patient Instructions
 Please start on red yeast rice 600mg  twice daily    Please have cholesterol checked in 6 months, order provided today    I'll plan for a follow up clinic visit in 1 year, or sooner if you're having questions or problems.     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  There is a shared phone number for this group of nurses, [phone].  Please contact this number should you have any questions about your visit today or concerns about upcoming appointments/procedures.  We would like to know if you are having any worrisome cardiac symptoms.     Please call [phone] if needing to schedule an office visit.     We are also available by e-mail through the MyChart system if you have non-urgent concerns. We will get back to you as soon as possible.

## 2024-04-26 DIAGNOSIS — E782 Mixed hyperlipidemia: Secondary | ICD-10-CM

## 2024-05-20 ENCOUNTER — Encounter: Admit: 2024-05-20 | Discharge: 2024-05-20 | Payer: MEDICARE

## 2024-06-11 ENCOUNTER — Encounter: Admit: 2024-06-11 | Discharge: 2024-06-11 | Payer: MEDICARE

## 2024-06-11 MED ORDER — DILTIAZEM HCL 120 MG PO CP24
120 mg | ORAL_CAPSULE | Freq: Every evening | ORAL | 1 refills | 90.00000 days | Status: AC
Start: 2024-06-11 — End: ?

## 2024-08-15 ENCOUNTER — Encounter: Admit: 2024-08-15 | Discharge: 2024-08-15 | Payer: MEDICARE

## 2024-10-17 ENCOUNTER — Encounter: Admit: 2024-10-17 | Discharge: 2024-10-17 | Payer: MEDICARE

## 2024-10-17 NOTE — Progress Notes [1]
 STAT    MEDICAL RECORDS REQUEST    Patient: Wesley Stanley, DOB: 1951-02-27    Please send records from the last 12 months.  Please include the following:      Most recent lab report    Please fax to [phone] ATTN Haylo Fake     Thank you!    Nakyiah Kuck, RN-BSN  [phone]  [email or web address]    Cardiac Clinical Nurse Coordinator for Dr. Dorn Meissner

## 2024-11-14 ENCOUNTER — Encounter: Admit: 2024-11-14 | Discharge: 2024-11-13 | Payer: MEDICARE

## 2024-12-05 ENCOUNTER — Encounter: Admit: 2024-12-05 | Discharge: 2024-12-05 | Payer: MEDICARE

## 2024-12-05 MED ORDER — DILTIAZEM HCL 120 MG PO CP24
120 mg | ORAL_CAPSULE | Freq: Every evening | ORAL | 1 refills | 90.00000 days | Status: AC
Start: 2024-12-05 — End: ?

## 2024-12-05 NOTE — Telephone Encounter [36]
 12/05/2024 7:50 AM   Protocol and chart reviewed. Refill approved.

## 2024-12-10 ENCOUNTER — Encounter: Admit: 2024-12-10 | Discharge: 2024-12-10 | Payer: MEDICARE
